# Patient Record
Sex: Male | Born: 1980 | Race: White | Hispanic: No | Marital: Married | Smoking: Current every day smoker
Health system: Southern US, Community
[De-identification: ages and names within clinical notes are randomized; demographics above are authoritative.]

## PROBLEM LIST (undated history)

## (undated) DIAGNOSIS — I10 Essential (primary) hypertension: Secondary | ICD-10-CM

## (undated) DIAGNOSIS — R569 Unspecified convulsions: Secondary | ICD-10-CM

## (undated) HISTORY — PX: OTHER SURGICAL HISTORY: SHX169

---

## 2008-01-03 ENCOUNTER — Emergency Department: Payer: Self-pay | Admitting: Emergency Medicine

## 2008-02-24 ENCOUNTER — Emergency Department: Payer: Self-pay | Admitting: Emergency Medicine

## 2008-04-10 ENCOUNTER — Emergency Department: Payer: Self-pay | Admitting: Emergency Medicine

## 2008-06-18 ENCOUNTER — Emergency Department: Payer: Self-pay | Admitting: Emergency Medicine

## 2008-06-18 ENCOUNTER — Other Ambulatory Visit: Payer: Self-pay

## 2008-09-09 ENCOUNTER — Emergency Department: Payer: Self-pay | Admitting: Emergency Medicine

## 2008-10-15 ENCOUNTER — Emergency Department: Payer: Self-pay | Admitting: Emergency Medicine

## 2009-06-07 ENCOUNTER — Emergency Department: Payer: Self-pay | Admitting: Emergency Medicine

## 2009-09-09 ENCOUNTER — Emergency Department: Payer: Self-pay | Admitting: Emergency Medicine

## 2009-09-14 ENCOUNTER — Emergency Department: Payer: Self-pay | Admitting: Emergency Medicine

## 2009-10-15 ENCOUNTER — Emergency Department: Payer: Self-pay | Admitting: Emergency Medicine

## 2009-12-30 ENCOUNTER — Emergency Department: Payer: Self-pay | Admitting: Internal Medicine

## 2010-01-21 ENCOUNTER — Emergency Department: Payer: Self-pay | Admitting: Emergency Medicine

## 2010-02-04 ENCOUNTER — Emergency Department: Payer: Self-pay | Admitting: Unknown Physician Specialty

## 2010-08-30 ENCOUNTER — Emergency Department: Payer: Self-pay | Admitting: Unknown Physician Specialty

## 2010-10-29 ENCOUNTER — Inpatient Hospital Stay: Payer: Self-pay | Admitting: Psychiatry

## 2010-11-24 ENCOUNTER — Emergency Department: Payer: Self-pay | Admitting: Emergency Medicine

## 2010-12-13 ENCOUNTER — Emergency Department: Payer: Self-pay | Admitting: Emergency Medicine

## 2011-03-28 ENCOUNTER — Emergency Department: Payer: Self-pay | Admitting: Emergency Medicine

## 2011-04-12 ENCOUNTER — Emergency Department: Payer: Self-pay | Admitting: Emergency Medicine

## 2011-04-13 ENCOUNTER — Emergency Department: Payer: Self-pay | Admitting: Emergency Medicine

## 2011-05-25 ENCOUNTER — Emergency Department: Payer: Self-pay | Admitting: Emergency Medicine

## 2011-05-27 ENCOUNTER — Emergency Department: Payer: Self-pay | Admitting: Unknown Physician Specialty

## 2011-07-04 ENCOUNTER — Emergency Department: Payer: Self-pay | Admitting: *Deleted

## 2011-12-04 ENCOUNTER — Emergency Department: Payer: Self-pay | Admitting: Emergency Medicine

## 2011-12-06 ENCOUNTER — Emergency Department: Payer: Self-pay | Admitting: Emergency Medicine

## 2012-02-02 ENCOUNTER — Emergency Department: Payer: Self-pay | Admitting: Emergency Medicine

## 2012-03-24 ENCOUNTER — Emergency Department: Payer: Self-pay | Admitting: Emergency Medicine

## 2013-05-02 ENCOUNTER — Emergency Department: Payer: Self-pay | Admitting: Internal Medicine

## 2013-05-02 LAB — CBC WITH DIFFERENTIAL/PLATELET
Basophil #: 0.1 10*3/uL (ref 0.0–0.1)
Basophil %: 0.4 %
Eosinophil #: 0.1 10*3/uL (ref 0.0–0.7)
Eosinophil %: 0.4 %
HCT: 39 % — ABNORMAL LOW (ref 40.0–52.0)
HGB: 14 g/dL (ref 13.0–18.0)
Lymphocyte #: 1.3 10*3/uL (ref 1.0–3.6)
Lymphocyte %: 7.3 %
MCH: 31.4 pg (ref 26.0–34.0)
MCHC: 35.9 g/dL (ref 32.0–36.0)
Monocyte #: 1.3 x10 3/mm — ABNORMAL HIGH (ref 0.2–1.0)
Monocyte %: 7.7 %
Platelet: 221 10*3/uL (ref 150–440)
RBC: 4.46 10*6/uL (ref 4.40–5.90)
RDW: 13.3 % (ref 11.5–14.5)
WBC: 17.4 10*3/uL — ABNORMAL HIGH (ref 3.8–10.6)

## 2013-05-02 LAB — URINALYSIS, COMPLETE
Bacteria: NONE SEEN
Bilirubin,UR: NEGATIVE
Glucose,UR: NEGATIVE mg/dL (ref 0–75)
Nitrite: NEGATIVE
Specific Gravity: 1.019 (ref 1.003–1.030)
Squamous Epithelial: NONE SEEN

## 2013-05-02 LAB — BASIC METABOLIC PANEL
Anion Gap: 7 (ref 7–16)
BUN: 11 mg/dL (ref 7–18)
Calcium, Total: 9.2 mg/dL (ref 8.5–10.1)
Creatinine: 0.67 mg/dL (ref 0.60–1.30)
EGFR (African American): >60
EGFR (Non-African Amer.): >60
Osmolality: 275 (ref 275–301)
Potassium: 3.6 mmol/L (ref 3.5–5.1)

## 2014-05-04 ENCOUNTER — Emergency Department: Payer: Self-pay | Admitting: Emergency Medicine

## 2014-07-10 ENCOUNTER — Emergency Department: Payer: Self-pay | Admitting: Internal Medicine

## 2014-07-28 ENCOUNTER — Emergency Department: Payer: Self-pay | Admitting: Emergency Medicine

## 2014-07-28 LAB — URINALYSIS, COMPLETE
BILIRUBIN, UR: NEGATIVE
BLOOD: NEGATIVE
Bacteria: NONE SEEN
GLUCOSE, UR: NEGATIVE mg/dL (ref 0–75)
Leukocyte Esterase: NEGATIVE
Nitrite: NEGATIVE
PH: 5 (ref 4.5–8.0)
Protein: NEGATIVE
RBC,UR: 6 /HPF (ref 0–5)
Specific Gravity: 1.035 (ref 1.003–1.030)
Squamous Epithelial: <1
WBC UR: 1 /HPF (ref 0–5)

## 2014-07-28 LAB — COMPREHENSIVE METABOLIC PANEL
AST: 16 U/L (ref 15–37)
Albumin: 3.7 g/dL (ref 3.4–5.0)
Alkaline Phosphatase: 90 U/L
Anion Gap: 7 (ref 7–16)
BUN: 14 mg/dL (ref 7–18)
Bilirubin,Total: 0.2 mg/dL (ref 0.2–1.0)
CREATININE: 0.81 mg/dL (ref 0.60–1.30)
Calcium, Total: 8.3 mg/dL — ABNORMAL LOW (ref 8.5–10.1)
Chloride: 108 mmol/L — ABNORMAL HIGH (ref 98–107)
Co2: 28 mmol/L (ref 21–32)
EGFR (African American): >60
EGFR (Non-African Amer.): >60
GLUCOSE: 82 mg/dL (ref 65–99)
OSMOLALITY: 285 (ref 275–301)
Potassium: 3.6 mmol/L (ref 3.5–5.1)
SGPT (ALT): 14 U/L
Sodium: 143 mmol/L (ref 136–145)
TOTAL PROTEIN: 6.8 g/dL (ref 6.4–8.2)

## 2014-07-28 LAB — CBC WITH DIFFERENTIAL/PLATELET
BASOS PCT: 0.4 %
Basophil #: 0 10*3/uL (ref 0.0–0.1)
Eosinophil #: 0.1 10*3/uL (ref 0.0–0.7)
Eosinophil %: 1.6 %
HCT: 40.2 % (ref 40.0–52.0)
HGB: 14.1 g/dL (ref 13.0–18.0)
LYMPHS ABS: 3 10*3/uL (ref 1.0–3.6)
Lymphocyte %: 34.2 %
MCH: 33.2 pg (ref 26.0–34.0)
MCHC: 35 g/dL (ref 32.0–36.0)
MCV: 95 fL (ref 80–100)
Monocyte #: 0.6 x10 3/mm (ref 0.2–1.0)
Monocyte %: 7.2 %
Neutrophil #: 5 10*3/uL (ref 1.4–6.5)
Neutrophil %: 56.6 %
Platelet: 211 10*3/uL (ref 150–440)
RBC: 4.23 10*6/uL — ABNORMAL LOW (ref 4.40–5.90)
RDW: 13.3 % (ref 11.5–14.5)
WBC: 8.8 10*3/uL (ref 3.8–10.6)

## 2014-07-28 LAB — LIPASE, BLOOD: LIPASE: 194 U/L (ref 73–393)

## 2014-07-28 LAB — TROPONIN I: Troponin-I: <0.02 ng/mL

## 2015-02-25 ENCOUNTER — Emergency Department: Payer: Self-pay | Admitting: Emergency Medicine

## 2015-03-22 ENCOUNTER — Emergency Department
Admit: 2015-03-22 | Disposition: A | Discharge status: Home / Self Care | Payer: Self-pay | Admitting: Physician Assistant

## 2015-06-17 ENCOUNTER — Emergency Department: Payer: Self-pay

## 2015-06-17 ENCOUNTER — Encounter: Payer: Self-pay | Admitting: Urgent Care

## 2015-06-17 ENCOUNTER — Emergency Department
Admission: EM | Admit: 2015-06-17 | Discharge: 2015-06-17 | Disposition: A | Discharge status: Home / Self Care | Payer: Self-pay | Attending: Emergency Medicine | Admitting: Emergency Medicine

## 2015-06-17 DIAGNOSIS — Z720 Tobacco use: Secondary | ICD-10-CM | POA: Insufficient documentation

## 2015-06-17 DIAGNOSIS — M778 Other enthesopathies, not elsewhere classified: Secondary | ICD-10-CM | POA: Insufficient documentation

## 2015-06-17 DIAGNOSIS — Z79899 Other long term (current) drug therapy: Secondary | ICD-10-CM | POA: Insufficient documentation

## 2015-06-17 HISTORY — DX: Essential (primary) hypertension: I10

## 2015-06-17 MED ORDER — TRAMADOL HCL 50 MG PO TABS: 50.0000 mg | ORAL_TABLET | Freq: Four times a day (QID) | ORAL | Status: DC | PRN

## 2015-06-17 MED ORDER — NAPROXEN 500 MG PO TABS
500.0000 mg | ORAL_TABLET | Freq: Once | ORAL | Status: AC
  Administered 2015-06-17: 500 mg via ORAL

## 2015-06-17 MED ORDER — TRAMADOL HCL 50 MG PO TABS
50.0000 mg | ORAL_TABLET | Freq: Once | ORAL | Status: AC
Start: 2015-06-17 — End: 2015-06-17
  Administered 2015-06-17: 50 mg via ORAL

## 2015-06-17 MED ORDER — NAPROXEN 500 MG PO TABS: 500.0000 mg | ORAL_TABLET | Freq: Two times a day (BID) | ORAL | Status: DC

## 2015-06-17 NOTE — ED Provider Notes (Signed)
[facility] Emergency Department Provider Note ____________________________________________  Time seen: Approximately 9:19 PM  I have reviewed the triage vital signs and the nursing notes.   HISTORY  Chief Complaint Hand Pain   HPI Nicholas Delgado is a 34 y.o. male who presents to the emergency room for right wrist and hand pain with radiation in to the forearm and elbow at times. Pain seems worse at night. He denies injury or repetitive motion. He is right hand dominate. Previous boxer's fracture. No previous injury to the area of pain.    Past Medical History  Diagnosis Date  . Hypertension     There are no active problems to display for this patient.   Past Surgical History  Procedure Laterality Date  . Arm surgery Left     Current Outpatient Rx  Name  Route  Sig  Dispense  Refill  . carvedilol (COREG) 6.25 MG tablet   Oral   Take 6.25 mg by mouth 2 (two) times daily with a meal.         . lisinopril (PRINIVIL,ZESTRIL) 20 MG tablet   Oral   Take 20 mg by mouth daily.         . naproxen (NAPROSYN) 500 MG tablet   Oral   Take 1 tablet (500 mg total) by mouth 2 (two) times daily with a meal.   60 tablet   2   . traMADol (ULTRAM) 50 MG tablet   Oral   Take 1 tablet (50 mg total) by mouth every 6 (six) hours as needed.   9 tablet   0     Allergies Sulfa antibiotics  No family history on file.  Social History History  Substance Use Topics  . Smoking status: Current Every Day Smoker -- 2.00 packs/day  . Smokeless tobacco: Not on file  . Alcohol Use: No    Review of Systems Constitutional: No recent illness. Eyes: No visual changes. ENT: No sore throat. Cardiovascular: Denies chest pain or palpitations. Respiratory: Denies shortness of breath. Gastrointestinal: No abdominal pain.  Genitourinary: Negative for dysuria. Musculoskeletal: Pain in right wrist/hand with radiation into forearm and elbow. Skin: Negative for  rash. Neurological: Negative for headaches, focal weakness or numbness. 10-point ROS otherwise negative.  ____________________________________________   PHYSICAL EXAM:  VITAL SIGNS: ED Triage Vitals  Enc Vitals Group     BP 06/17/15 2031 149/88 mmHg     Pulse Rate 06/17/15 2031 98     Resp 06/17/15 2031 18     Temp 06/17/15 2031 98.4 F (36.9 C)     Temp Source 06/17/15 2031 Oral     SpO2 06/17/15 2031 98 %     Weight 06/17/15 2031 170 lb (77.111 kg)     Height 06/17/15 2031 6' (1.829 m)     Head Cir --      Peak Flow --      Pain Score 06/17/15 2032 10     Pain Loc --      Pain Edu? --      Excl. in GC? --     Constitutional: Alert and oriented. Well appearing and in no acute distress. Eyes: Conjunctivae are normal. EOMI. Head: Atraumatic. Nose: No congestion/rhinnorhea. Neck: No stridor.  Respiratory: Normal respiratory effort.   Musculoskeletal: Very mild edema noted to hand. Tenderness diffuse over hand/wrist to palpation. Pain worse with flexion. ROM limited due to pain. No obvious deformity.  Neurologic:  Normal speech and language. No gross focal neurologic deficits are  appreciated. Speech is normal. No gait instability. Grip weaker on right due to pain. Skin:  Skin is warm, dry and intact. Atraumatic. Psychiatric: Mood and affect are normal. Speech and behavior are normal.  ____________________________________________   LABS (all labs ordered are listed, but only abnormal results are displayed)  Labs Reviewed - No data to display ____________________________________________  RADIOLOGY  Negative for acute pathology. ____________________________________________   PROCEDURES  Procedure(s) performed: Velcro wrist splint applied by tech. Neurovascularly intact post application.   ____________________________________________   INITIAL IMPRESSION / ASSESSMENT AND PLAN / ED COURSE  Pertinent labs & imaging results that were available during my care of  the patient were reviewed by me and considered in my medical decision making (see chart for details).  Patient was advised to follow up with the orthopedic doctor for symptoms that are not improving over the next 2 weeks. He was advised to return to the ER for symptoms that change or worsen if unable to schedule an appointment. ____________________________________________   FINAL CLINICAL IMPRESSION(S) / ED DIAGNOSES  Final diagnoses:  Right wrist tendonitis       Chinita Pester, FNP 06/17/15 2225  Myrna Blazer, MD 06/17/15 2232

## 2015-06-17 NOTE — ED Notes (Signed)
Patient with complaint of intermittent right hand pain times one month. Patient reports that the pain shoots up his arm to his elbow. Patient also reports swelling to his hand. Denies injury.

## 2015-06-17 NOTE — ED Notes (Signed)
Right hand pain and swelling with no known injury

## 2015-06-17 NOTE — ED Notes (Signed)
Patient presents with c/o RIGHT hand pain x 2 weeks. (+) swelling. Patient denies injury. Reports radiation of pain at times into shoulder.

## 2015-07-02 DIAGNOSIS — Z791 Long term (current) use of non-steroidal anti-inflammatories (NSAID): Secondary | ICD-10-CM | POA: Insufficient documentation

## 2015-07-02 DIAGNOSIS — Z720 Tobacco use: Secondary | ICD-10-CM | POA: Insufficient documentation

## 2015-07-02 DIAGNOSIS — Z79899 Other long term (current) drug therapy: Secondary | ICD-10-CM | POA: Insufficient documentation

## 2015-07-02 DIAGNOSIS — Y998 Other external cause status: Secondary | ICD-10-CM | POA: Insufficient documentation

## 2015-07-02 DIAGNOSIS — W208XXA Other cause of strike by thrown, projected or falling object, initial encounter: Secondary | ICD-10-CM | POA: Insufficient documentation

## 2015-07-02 DIAGNOSIS — Y9389 Activity, other specified: Secondary | ICD-10-CM | POA: Insufficient documentation

## 2015-07-02 DIAGNOSIS — I10 Essential (primary) hypertension: Secondary | ICD-10-CM | POA: Insufficient documentation

## 2015-07-02 DIAGNOSIS — S9031XA Contusion of right foot, initial encounter: Secondary | ICD-10-CM | POA: Insufficient documentation

## 2015-07-02 DIAGNOSIS — Y9289 Other specified places as the place of occurrence of the external cause: Secondary | ICD-10-CM | POA: Insufficient documentation

## 2015-07-03 ENCOUNTER — Emergency Department: Payer: Self-pay

## 2015-07-03 ENCOUNTER — Emergency Department
Admission: EM | Admit: 2015-07-03 | Discharge: 2015-07-03 | Disposition: A | Discharge status: Home / Self Care | Payer: Self-pay | Attending: Emergency Medicine | Admitting: Emergency Medicine

## 2015-07-03 ENCOUNTER — Encounter: Payer: Self-pay | Admitting: *Deleted

## 2015-07-03 DIAGNOSIS — T1490XA Injury, unspecified, initial encounter: Secondary | ICD-10-CM

## 2015-07-03 DIAGNOSIS — S9031XA Contusion of right foot, initial encounter: Secondary | ICD-10-CM

## 2015-07-03 MED ORDER — OXYCODONE-ACETAMINOPHEN 5-325 MG PO TABS
ORAL_TABLET | ORAL | Status: AC
  Filled 2015-07-03: qty 1

## 2015-07-03 MED ORDER — OXYCODONE-ACETAMINOPHEN 5-325 MG PO TABS: 1.0000 | ORAL_TABLET | ORAL | Status: DC | PRN

## 2015-07-03 MED ORDER — OXYCODONE-ACETAMINOPHEN 5-325 MG PO TABS: 1.0000 | ORAL_TABLET | Freq: Once | ORAL | Status: DC

## 2015-07-03 MED ORDER — OXYCODONE-ACETAMINOPHEN 5-325 MG PO TABS
1.0000 | ORAL_TABLET | Freq: Once | ORAL | Status: AC
Start: 2015-07-03 — End: 2015-07-03
  Administered 2015-07-03: 1 via ORAL
  Filled 2015-07-03: qty 1

## 2015-07-03 NOTE — ED Notes (Signed)
Pt to triage via wheelchair.  Pt has right foot pain.  Pt states a metal pipe fell onto right foot today    Pt was wearing boots when incident occurred.  Denies other injury.

## 2015-07-03 NOTE — Discharge Instructions (Signed)
Contusion °A contusion is a deep bruise. Contusions are the result of an injury that caused bleeding under the skin. The contusion may turn blue, purple, or yellow. Minor injuries will give you a painless contusion, but more severe contusions may stay painful and swollen for a few weeks.  °CAUSES  °A contusion is usually caused by a blow, trauma, or direct force to an area of the body. °SYMPTOMS  °· Swelling and redness of the injured area. °· Bruising of the injured area. °· Tenderness and soreness of the injured area. °· Pain. °DIAGNOSIS  °The diagnosis can be made by taking a history and physical exam. An X-ray, CT scan, or MRI may be needed to determine if there were any associated injuries, such as fractures. °TREATMENT  °Specific treatment will depend on what area of the body was injured. In general, the best treatment for a contusion is resting, icing, elevating, and applying cold compresses to the injured area. Over-the-counter medicines may also be recommended for pain control. Ask your caregiver what the best treatment is for your contusion. °HOME CARE INSTRUCTIONS  °· Put ice on the injured area. °¨ Put ice in a plastic bag. °¨ Place a towel between your skin and the bag. °¨ Leave the ice on for 15-20 minutes, 3-4 times a day, or as directed by your health care provider. °· Only take over-the-counter or prescription medicines for pain, discomfort, or fever as directed by your caregiver. Your caregiver may recommend avoiding anti-inflammatory medicines (aspirin, ibuprofen, and naproxen) for 48 hours because these medicines may increase bruising. °· Rest the injured area. °· If possible, elevate the injured area to reduce swelling. °SEEK IMMEDIATE MEDICAL CARE IF:  °· You have increased bruising or swelling. °· You have pain that is getting worse. °· Your swelling or pain is not relieved with medicines. °MAKE SURE YOU:  °· Understand these instructions. °· Will watch your condition. °· Will get help right  away if you are not doing well or get worse. °Document Released: 09/15/2005 Document Revised: 12/11/2013 Document Reviewed: 10/11/2011 °[employer]® Patient Information ©2015 [employer]. This information is not intended to replace advice given to you by your health care provider. Make sure you discuss any questions you have with your health care provider. ° °

## 2015-07-03 NOTE — ED Provider Notes (Signed)
[facility] Emergency Department Provider Note  ____________________________________________  Time seen: 5:00 AM  I have reviewed the triage vital signs and the nursing notes.   HISTORY  Chief Complaint Foot Injury      HPI Nicholas Delgado is a 34 y.o. male presents with right foot pain 7 out of 10. Patient states that a metal pipe fell on his foot today. Patient states he was wearing boots at the time of the incident.     Past Medical History  Diagnosis Date  . Hypertension     There are no active problems to display for this patient.   Past Surgical History  Procedure Laterality Date  . Arm surgery Left     Current Outpatient Rx  Name  Route  Sig  Dispense  Refill  . carvedilol (COREG) 6.25 MG tablet   Oral   Take 6.25 mg by mouth 2 (two) times daily with a meal.         . lisinopril (PRINIVIL,ZESTRIL) 20 MG tablet   Oral   Take 20 mg by mouth daily.         . naproxen (NAPROSYN) 500 MG tablet   Oral   Take 1 tablet (500 mg total) by mouth 2 (two) times daily with a meal.   60 tablet   2   . traMADol (ULTRAM) 50 MG tablet   Oral   Take 1 tablet (50 mg total) by mouth every 6 (six) hours as needed.   9 tablet   0     Allergies Sulfa antibiotics  No family history on file.  Social History History  Substance Use Topics  . Smoking status: Current Every Day Smoker -- 2.00 packs/day  . Smokeless tobacco: Not on file  . Alcohol Use: No    Review of Systems  Constitutional: Negative for fever. Eyes: Negative for visual changes. ENT: Negative for sore throat. Cardiovascular: Negative for chest pain. Respiratory: Negative for shortness of breath. Gastrointestinal: Negative for abdominal pain, vomiting and diarrhea. Genitourinary: Negative for dysuria. Musculoskeletal: Negative for back pain. Positive for right foot pain Skin: Negative for rash. Neurological: Negative for headaches, focal weakness or  numbness.  10-point ROS otherwise negative.  ____________________________________________   PHYSICAL EXAM:  VITAL SIGNS: ED Triage Vitals  Enc Vitals Group     BP 07/03/15 0007 146/96 mmHg     Pulse Rate 07/03/15 0007 104     Resp 07/03/15 0007 20     Temp 07/03/15 0007 99.6 F (37.6 C)     Temp Source 07/03/15 0007 Oral     SpO2 07/03/15 0007 98 %     Weight 07/03/15 0007 175 lb (79.379 kg)     Height 07/03/15 0007 6' (1.829 m)     Head Cir --      Peak Flow --      Pain Score 07/03/15 0009 8     Pain Loc --      Pain Edu? --      Excl. in GC? --     Constitutional: Alert and oriented. Well appearing and in no distress. Eyes: Conjunctivae are normal. PERRL. Normal extraocular movements. ENT   Head: Normocephalic and atraumatic.   Nose: No congestion/rhinnorhea.   Mouth/Throat: Mucous membranes are moist.   Neck: No stridor. Hematological/Lymphatic/Immunilogical: No cervical lymphadenopathy. Cardiovascular: Normal rate, regular rhythm. Normal and symmetric distal pulses are present in all extremities. No murmurs, rubs, or gallops. Respiratory: Normal respiratory effort without tachypnea nor retractions. Breath sounds  are clear and equal bilaterally. No wheezes/rales/rhonchi. Gastrointestinal: Soft and nontender. No distention. There is no CVA tenderness. Genitourinary: deferred Musculoskeletal: Nontender with normal range of motion in all extremities. No joint effusions.  No lower extremity tenderness nor edema. Neurologic:  Normal speech and language. No gross focal neurologic deficits are appreciated. Speech is normal.  Skin:  Skin is warm, dry and intact. No rash noted. Psychiatric: Mood and affect are normal. Speech and behavior are normal. Patient exhibits appropriate insight and judgment.  ____________________________________________       RADIOLOGY  Right foot x-ray revealed:  IMPRESSION: Negative.   Electronically Signed By: Burman NievesWilliam  Stevens M.D. On: 07/03/2015 01:13   ____________________________________________     INITIAL IMPRESSION / ASSESSMENT AND PLAN / ED COURSE  Pertinent labs & imaging results that were available during my care of the patient were reviewed by me and considered in my medical decision making (see chart for details). History and physical exam, foot x-ray consistent with right foot contusion   ____________________________________________   FINAL CLINICAL IMPRESSION(S) / ED DIAGNOSES  Final diagnoses:  Foot contusion, right, initial encounter      Darci Currentandolph N Brown, MD 07/08/15 [phone]

## 2015-07-03 NOTE — ED Notes (Signed)
MD at bedside. 

## 2015-07-11 ENCOUNTER — Emergency Department
Admission: EM | Admit: 2015-07-11 | Discharge: 2015-07-11 | Disposition: A | Discharge status: Home / Self Care | Payer: BLUE CROSS/BLUE SHIELD | Attending: Emergency Medicine | Admitting: Emergency Medicine

## 2015-07-11 ENCOUNTER — Encounter: Payer: Self-pay | Admitting: Emergency Medicine

## 2015-07-11 DIAGNOSIS — S9031XD Contusion of right foot, subsequent encounter: Secondary | ICD-10-CM | POA: Diagnosis not present

## 2015-07-11 DIAGNOSIS — S92241A Displaced fracture of medial cuneiform of right foot, initial encounter for closed fracture: Secondary | ICD-10-CM | POA: Insufficient documentation

## 2015-07-11 DIAGNOSIS — Z79899 Other long term (current) drug therapy: Secondary | ICD-10-CM | POA: Diagnosis not present

## 2015-07-11 DIAGNOSIS — I10 Essential (primary) hypertension: Secondary | ICD-10-CM | POA: Diagnosis not present

## 2015-07-11 DIAGNOSIS — Z791 Long term (current) use of non-steroidal anti-inflammatories (NSAID): Secondary | ICD-10-CM | POA: Diagnosis not present

## 2015-07-11 DIAGNOSIS — Y9389 Activity, other specified: Secondary | ICD-10-CM | POA: Insufficient documentation

## 2015-07-11 DIAGNOSIS — S99921A Unspecified injury of right foot, initial encounter: Secondary | ICD-10-CM | POA: Diagnosis present

## 2015-07-11 DIAGNOSIS — Z720 Tobacco use: Secondary | ICD-10-CM | POA: Insufficient documentation

## 2015-07-11 DIAGNOSIS — Y998 Other external cause status: Secondary | ICD-10-CM | POA: Diagnosis not present

## 2015-07-11 DIAGNOSIS — W208XXA Other cause of strike by thrown, projected or falling object, initial encounter: Secondary | ICD-10-CM | POA: Insufficient documentation

## 2015-07-11 DIAGNOSIS — Y9289 Other specified places as the place of occurrence of the external cause: Secondary | ICD-10-CM | POA: Insufficient documentation

## 2015-07-11 MED ORDER — TRAMADOL HCL 50 MG PO TABS
50.0000 mg | ORAL_TABLET | Freq: Four times a day (QID) | ORAL | Status: DC | PRN
Start: 2015-07-11 — End: 2015-08-26

## 2015-07-11 MED ORDER — MELOXICAM 15 MG PO TABS: 15.0000 mg | ORAL_TABLET | Freq: Every day | ORAL | Status: DC

## 2015-07-11 NOTE — ED Notes (Signed)
Pt states he dropped a pipe on his right foot one week ago and xray was negative. Pt states he has had continued pain in foot.

## 2015-07-11 NOTE — Discharge Instructions (Signed)
°  Splint Care Splints protect and rest injuries. Splints can be made of plaster, fiberglass, or metal. They are used to treat broken bones, sprains, tendonitis, and other injuries. HOME CARE  Keep the injured area raised (elevated) while sitting or lying down. Keep the injured body part just above the level of the heart. This will decrease puffiness (swelling) and pain.  If an elastic bandage was used to hold the splint, it can be loosened. Only loosen it to make room for puffiness and to ease pain.  Keep the splint clean and dry.  Do not scratch the skin under the splint with sharp or pointed objects.  Follow up with your doctor as told. GET HELP RIGHT AWAY IF:   There is more pain or pressure around the injury.  There is numbness, tingling, or pain in the toes or fingers past the injury.  The fingers or toes become cold or blue.  The splint becomes too soft or breaks before the injury is healed. MAKE SURE YOU:   Understand these instructions.  Will watch this condition.  Will get help right away if you are not doing well or get worse. Document Released: 09/14/2008 Document Revised: 02/28/2012 Document Reviewed: 09/14/2008 [facility] Patient Information 2015 [place], [place]. This information is not intended to replace advice given to you by your health care provider. Make sure you discuss any questions you have with your health care provider.  Cuneiform Fracture, Simple Cuneiform Fracture (simple) is a break (fracture) of one of your cuneiform bones. This is one of the bones located in the middle of your foot. When fractures are small and not out of place, they may be treated conservatively. This means that only a cast is needed for treatment. At first, no walking may be allowed, but following a period of time, your caregiver may allow you to have a walking cast. DIAGNOSIS  The diagnosis of a fractured cuneiform is made by X-ray. X-rays may be required before and after the injury is  put into a splint or cast. HOME CARE INSTRUCTIONS   Only take over-the-counter or prescription medicines for pain, discomfort or fever as directed by your caregiver.  If you have a splint held on with an elastic wrap and your foot or toes become numb or cold and blue, loosen the wrap and reapply more loosely. See your caregiver if there is no relief.  Use your injured foot as directed. Do not drive a vehicle until your caregiver specifically tells you it is safe to do so.  WARNING: See your caregiver as directed. It is important to keep all follow-up appointments in order to provide the best opportunity for healing and to avoid disability and chronic pain. SEEK IMMEDIATE MEDICAL CARE IF:   There is swelling or increasing pain in foot.  You begin to lose feeling in your foot or toes, or develop swelling of the foot or toes.  The foot or toes on the injured side are blue or cold.  You develop pain, which is not controlled by the medications. MAKE SURE YOU:   Understand these instructions.  Will watch your condition.  Will get help right away if you are not doing well or get worse. Document Released: 08/28/2002 Document Revised: 02/28/2012 Document Reviewed: 07/11/2008 [facility] Patient Information 2015 [place], [place]. This information is not intended to replace advice given to you by your health care provider. Make sure you discuss any questions you have with your health care provider.

## 2015-07-11 NOTE — ED Provider Notes (Signed)
CSN: [id number]     Arrival date & time 07/11/15  1956 History   First MD Initiated Contact with Patient 07/11/15 2018     Chief Complaint  Patient presents with  . Foot Injury     (Consider location/radiation/quality/duration/timing/severity/associated sxs/prior Treatment) HPI  34 year old male presents to the emergency department for evaluation of right foot pain. Initial injury occurred on 07/03/2015, he was seen in the emergency department that day and had x-rays of the right foot. X-rays were read as no acute fracture. Pain occurred as he dropped a heavy pipe onto his right foot. She was wearing his boots. His pain is on the dorsum of the right midfoot just above the medial cuneiform his pain has been 7 out of 10. He has been walking on this with pain. He is been unable to work as he stands. As any numbness or tingling. States he has significant bruising and swelling that has resolved over the last few days. Any medications for pain. Past Medical History  Diagnosis Date  . Hypertension    Past Surgical History  Procedure Laterality Date  . Arm surgery Left    History reviewed. No pertinent family history. History  Substance Use Topics  . Smoking status: Current Every Day Smoker -- 2.00 packs/day  . Smokeless tobacco: Not on file  . Alcohol Use: No    Review of Systems  Constitutional: Negative.  Negative for fever and activity change.  HENT: Negative for congestion, sore throat and trouble swallowing.   Eyes: Negative for photophobia, pain and discharge.  Respiratory: Negative for cough, chest tightness and shortness of breath.   Cardiovascular: Negative for chest pain and leg swelling.  Gastrointestinal: Negative for nausea, vomiting, abdominal pain and diarrhea.  Musculoskeletal: Positive for joint swelling, arthralgias and gait problem. Negative for back pain.  Skin: Negative for color change and rash.  Neurological: Negative for dizziness and headaches.  Hematological:  Negative for adenopathy.  Psychiatric/Behavioral: Negative for behavioral problems and agitation.      Allergies  Sulfa antibiotics  Home Medications   Prior to Admission medications   Medication Sig Start Date End Date Taking? Authorizing Provider  carvedilol (COREG) 6.25 MG tablet Take 6.25 mg by mouth 2 (two) times daily with a meal.    Historical Provider, MD  lisinopril (PRINIVIL,ZESTRIL) 20 MG tablet Take 20 mg by mouth daily.    Historical Provider, MD  meloxicam (MOBIC) 15 MG tablet Take 1 tablet (15 mg total) by mouth daily. 07/11/15   Evon Slack, PA-C  naproxen (NAPROSYN) 500 MG tablet Take 1 tablet (500 mg total) by mouth 2 (two) times daily with a meal. 06/17/15 06/16/16  Chinita Pester, FNP  oxyCODONE-acetaminophen (PERCOCET/ROXICET) 5-325 MG per tablet Take 1 tablet by mouth every 4 (four) hours as needed for severe pain. 07/03/15   Darci Current, MD  traMADol (ULTRAM) 50 MG tablet Take 1 tablet (50 mg total) by mouth every 6 (six) hours as needed. 06/17/15   Chinita Pester, FNP  traMADol (ULTRAM) 50 MG tablet Take 1 tablet (50 mg total) by mouth every 6 (six) hours as needed. 07/11/15   Evon Slack, PA-C   BP 144/101 mmHg  Temp(Src) 98.6 F (37 C) (Oral)  Resp 20  Ht 6' (1.829 m)  Wt 180 lb (81.647 kg)  BMI 24.41 kg/m2  SpO2 100% Physical Exam  Constitutional: He is oriented to person, place, and time. He appears well-developed and well-nourished.  HENT:  Head: Normocephalic and atraumatic.  Eyes: Conjunctivae and EOM are normal. Pupils are equal, round, and reactive to light.  Neck: Normal range of motion. Neck supple.  Cardiovascular: Normal rate and intact distal pulses.   Pulmonary/Chest: Effort normal. No respiratory distress.  Musculoskeletal:  Right foot with no swelling warmth or erythema. He is tender to palpation over the dorsal aspect of the right foot along the medial cuneiform. He is point tender. No loss of ankle range of motion. Negative  Thompson's test. Sensation is intact throughout the right foot.  Neurological: He is alert and oriented to person, place, and time.  Skin: Skin is warm and dry.  Psychiatric: He has a normal mood and affect. His behavior is normal. Judgment and thought content normal.    ED Course  Procedures (including critical care time) The patient was placed into a right short leg splint with Ortho-Glass. Splint was applied by tech. Crutches were given. She was neurovascular intact after splint application.   Labs Review Labs Reviewed - No data to display  Imaging Review No results found.  X-rays of the right foot reviewed by me from [facility] on 07/03/2015: There appears to be a small avulsion off the medial cuneiform which correlates with patient's pain.   EKG Interpretation None      MDM   Final diagnoses:  Closed fracture of medial cuneiform bone of foot, right, initial encounter  Contusion, foot, right, subsequent encounter    34 year old male with injury to his right foot on 07/03/2015. X-rays were read as no acute fracture. After review of the x-rays, there appears to be a very tiny avulsion fracture on the dorsal aspect of the medial cuneiform bone. Patient was placed into a short leg splint for comfort. He was given crutches to stay nonweightbearing. He will follow-up with Dr. Deeann Saint at [facility] in 2-3 days. He is given a prescription for Ultram and meloxicam for pain. He will rest ice and elevate the lower extremity    Evon Slack, PA-C 07/11/15 2053  Myrna Blazer, MD 07/12/15 [phone]

## 2015-08-24 ENCOUNTER — Emergency Department
Admission: EM | Admit: 2015-08-24 | Discharge: 2015-08-24 | Disposition: A | Discharge status: Other Acute Inpatient Hospital | Payer: BLUE CROSS/BLUE SHIELD | Attending: Emergency Medicine | Admitting: Emergency Medicine

## 2015-08-24 ENCOUNTER — Inpatient Hospital Stay
Admission: AD | Admit: 2015-08-24 | Discharge: 2015-08-26 | DRG: 897 | Disposition: A | Discharge status: Home / Self Care | Payer: BLUE CROSS/BLUE SHIELD | Source: Intra-hospital | Attending: Psychiatry | Admitting: Psychiatry

## 2015-08-24 DIAGNOSIS — G4700 Insomnia, unspecified: Secondary | ICD-10-CM | POA: Diagnosis present

## 2015-08-24 DIAGNOSIS — F1110 Opioid abuse, uncomplicated: Secondary | ICD-10-CM | POA: Insufficient documentation

## 2015-08-24 DIAGNOSIS — Z791 Long term (current) use of non-steroidal anti-inflammatories (NSAID): Secondary | ICD-10-CM | POA: Insufficient documentation

## 2015-08-24 DIAGNOSIS — Z720 Tobacco use: Secondary | ICD-10-CM | POA: Insufficient documentation

## 2015-08-24 DIAGNOSIS — E785 Hyperlipidemia, unspecified: Secondary | ICD-10-CM

## 2015-08-24 DIAGNOSIS — F1120 Opioid dependence, uncomplicated: Secondary | ICD-10-CM | POA: Diagnosis present

## 2015-08-24 DIAGNOSIS — F3289 Other specified depressive episodes: Secondary | ICD-10-CM

## 2015-08-24 DIAGNOSIS — F1194 Opioid use, unspecified with opioid-induced mood disorder: Secondary | ICD-10-CM | POA: Diagnosis not present

## 2015-08-24 DIAGNOSIS — Z79899 Other long term (current) drug therapy: Secondary | ICD-10-CM | POA: Diagnosis not present

## 2015-08-24 DIAGNOSIS — F1123 Opioid dependence with withdrawal: Secondary | ICD-10-CM | POA: Diagnosis present

## 2015-08-24 DIAGNOSIS — F17210 Nicotine dependence, cigarettes, uncomplicated: Secondary | ICD-10-CM | POA: Diagnosis present

## 2015-08-24 DIAGNOSIS — I10 Essential (primary) hypertension: Secondary | ICD-10-CM | POA: Insufficient documentation

## 2015-08-24 DIAGNOSIS — F1124 Opioid dependence with opioid-induced mood disorder: Secondary | ICD-10-CM | POA: Diagnosis present

## 2015-08-24 DIAGNOSIS — F329 Major depressive disorder, single episode, unspecified: Secondary | ICD-10-CM | POA: Diagnosis present

## 2015-08-24 DIAGNOSIS — Z9889 Other specified postprocedural states: Secondary | ICD-10-CM

## 2015-08-24 DIAGNOSIS — Z818 Family history of other mental and behavioral disorders: Secondary | ICD-10-CM

## 2015-08-24 DIAGNOSIS — F17200 Nicotine dependence, unspecified, uncomplicated: Secondary | ICD-10-CM

## 2015-08-24 DIAGNOSIS — F1193 Opioid use, unspecified with withdrawal: Secondary | ICD-10-CM

## 2015-08-24 DIAGNOSIS — F32A Depression, unspecified: Secondary | ICD-10-CM

## 2015-08-24 LAB — URINE DRUG SCREEN, QUALITATIVE (ARMC ONLY)
Amphetamines, Ur Screen: NOT DETECTED
BENZODIAZEPINE, UR SCRN: POSITIVE — AB
Barbiturates, Ur Screen: NOT DETECTED
CANNABINOID 50 NG, UR ~~LOC~~: NOT DETECTED
Cocaine Metabolite,Ur ~~LOC~~: NOT DETECTED
MDMA (Ecstasy)Ur Screen: NOT DETECTED
Methadone Scn, Ur: NOT DETECTED
Opiate, Ur Screen: POSITIVE — AB
Phencyclidine (PCP) Ur S: NOT DETECTED
TRICYCLIC, UR SCREEN: NOT DETECTED

## 2015-08-24 LAB — COMPREHENSIVE METABOLIC PANEL
ALBUMIN: 3.8 g/dL (ref 3.5–5.0)
ALK PHOS: 90 U/L (ref 38–126)
ALT: 14 U/L — ABNORMAL LOW (ref 17–63)
ANION GAP: 6 (ref 5–15)
AST: 28 U/L (ref 15–41)
BUN: 18 mg/dL (ref 6–20)
CO2: 26 mmol/L (ref 22–32)
Calcium: 8.6 mg/dL — ABNORMAL LOW (ref 8.9–10.3)
Chloride: 106 mmol/L (ref 101–111)
Creatinine, Ser: 0.63 mg/dL (ref 0.61–1.24)
GFR calc Af Amer: >60 mL/min (ref >60)
GFR calc non Af Amer: >60 mL/min (ref >60)
GLUCOSE: 127 mg/dL — AB (ref 65–99)
Potassium: 3.8 mmol/L (ref 3.5–5.1)
SODIUM: 138 mmol/L (ref 135–145)
Total Bilirubin: 0.7 mg/dL (ref 0.3–1.2)
Total Protein: 6.5 g/dL (ref 6.5–8.1)

## 2015-08-24 LAB — ACETAMINOPHEN LEVEL
ACETAMINOPHEN (TYLENOL), SERUM: 14 ug/mL (ref 10–30)
ACETAMINOPHEN (TYLENOL), SERUM: 14 ug/mL (ref 10–30)

## 2015-08-24 LAB — CBC
HEMATOCRIT: 37.2 % — AB (ref 40.0–52.0)
HEMOGLOBIN: 13.2 g/dL (ref 13.0–18.0)
MCH: 31.8 pg (ref 26.0–34.0)
MCHC: 35.5 g/dL (ref 32.0–36.0)
MCV: 89.6 fL (ref 80.0–100.0)
PLATELETS: 171 10*3/uL (ref 150–440)
RBC: 4.15 MIL/uL — ABNORMAL LOW (ref 4.40–5.90)
RDW: 13.5 % (ref 11.5–14.5)
WBC: 7.7 10*3/uL (ref 3.8–10.6)

## 2015-08-24 LAB — SALICYLATE LEVEL
SALICYLATE LVL: 6.6 mg/dL (ref 2.8–30.0)
Salicylate Lvl: 5.2 mg/dL (ref 2.8–30.0)
Salicylate Lvl: 4.9 mg/dL (ref 2.8–30.0)

## 2015-08-24 LAB — ETHANOL: Alcohol, Ethyl (B): <5 mg/dL (ref <5)

## 2015-08-24 MED ORDER — GABAPENTIN 300 MG PO CAPS
900.0000 mg | ORAL_CAPSULE | Freq: Four times a day (QID) | ORAL | Status: DC
  Administered 2015-08-24 – 2015-08-26 (×7): 900 mg via ORAL
  Filled 2015-08-24 (×7): qty 3

## 2015-08-24 MED ORDER — MAGNESIUM HYDROXIDE 400 MG/5ML PO SUSP: 30.0000 mL | Freq: Every day | ORAL | Status: DC | PRN

## 2015-08-24 MED ORDER — CARVEDILOL 6.25 MG PO TABS
6.2500 mg | ORAL_TABLET | Freq: Two times a day (BID) | ORAL | Status: DC
  Administered 2015-08-25 – 2015-08-26 (×3): 6.25 mg via ORAL
  Filled 2015-08-24 (×3): qty 1

## 2015-08-24 MED ORDER — ACETAMINOPHEN 325 MG PO TABS
650.0000 mg | ORAL_TABLET | Freq: Four times a day (QID) | ORAL | Status: DC | PRN
  Administered 2015-08-24: 650 mg via ORAL
  Filled 2015-08-24: qty 2

## 2015-08-24 MED ORDER — NICOTINE 21 MG/24HR TD PT24
21.0000 mg | MEDICATED_PATCH | Freq: Every day | TRANSDERMAL | Status: DC
  Administered 2015-08-25: 21 mg via TRANSDERMAL
  Filled 2015-08-24: qty 1

## 2015-08-24 MED ORDER — LORAZEPAM 1 MG PO TABS
1.0000 mg | ORAL_TABLET | Freq: Once | ORAL | Status: AC
  Administered 2015-08-24: 1 mg via ORAL

## 2015-08-24 MED ORDER — TRAZODONE HCL 100 MG PO TABS
100.0000 mg | ORAL_TABLET | Freq: Every evening | ORAL | Status: DC | PRN
  Administered 2015-08-24: 100 mg via ORAL
  Filled 2015-08-24: qty 1

## 2015-08-24 MED ORDER — LORAZEPAM 1 MG PO TABS
ORAL_TABLET | ORAL | Status: AC
  Administered 2015-08-24: 1 mg via ORAL
  Filled 2015-08-24: qty 1

## 2015-08-24 MED ORDER — DICLOFENAC SODIUM 1 % TD GEL
2.0000 g | Freq: Two times a day (BID) | TRANSDERMAL | Status: DC
  Administered 2015-08-25: 2 g via TOPICAL
  Filled 2015-08-24: qty 100

## 2015-08-24 MED ORDER — ALUM & MAG HYDROXIDE-SIMETH 200-200-20 MG/5ML PO SUSP: 30.0000 mL | ORAL | Status: DC | PRN

## 2015-08-24 MED ORDER — MELOXICAM 7.5 MG PO TABS
15.0000 mg | ORAL_TABLET | Freq: Every day | ORAL | Status: DC
  Administered 2015-08-25 – 2015-08-26 (×2): 15 mg via ORAL
  Filled 2015-08-24 (×2): qty 2

## 2015-08-24 MED ORDER — HYDROXYZINE HCL 50 MG PO TABS
50.0000 mg | ORAL_TABLET | Freq: Four times a day (QID) | ORAL | Status: DC | PRN
  Administered 2015-08-24: 50 mg via ORAL
  Filled 2015-08-24: qty 1

## 2015-08-24 MED ORDER — ATORVASTATIN CALCIUM 20 MG PO TABS
20.0000 mg | ORAL_TABLET | Freq: Every day | ORAL | Status: DC
  Administered 2015-08-24 – 2015-08-25 (×2): 20 mg via ORAL
  Filled 2015-08-24 (×2): qty 1

## 2015-08-24 MED ORDER — LOPERAMIDE HCL 2 MG PO CAPS
2.0000 mg | ORAL_CAPSULE | Freq: Three times a day (TID) | ORAL | Status: DC
  Administered 2015-08-24 – 2015-08-26 (×5): 2 mg via ORAL
  Filled 2015-08-24 (×5): qty 1

## 2015-08-24 MED ORDER — HYDROXYZINE HCL 25 MG PO TABS
50.0000 mg | ORAL_TABLET | Freq: Once | ORAL | Status: AC
  Administered 2015-08-24: 50 mg via ORAL
  Filled 2015-08-24: qty 2

## 2015-08-24 MED ORDER — LISINOPRIL 10 MG PO TABS
20.0000 mg | ORAL_TABLET | Freq: Every day | ORAL | Status: DC
  Administered 2015-08-25 – 2015-08-26 (×2): 20 mg via ORAL
  Filled 2015-08-24 (×2): qty 2

## 2015-08-24 MED ORDER — PROMETHAZINE HCL 25 MG PO TABS
12.5000 mg | ORAL_TABLET | Freq: Three times a day (TID) | ORAL | Status: DC
  Administered 2015-08-24 – 2015-08-26 (×6): 12.5 mg via ORAL
  Filled 2015-08-24 (×6): qty 1

## 2015-08-24 NOTE — ED Notes (Signed)
BEHAVIORAL HEALTH ROUNDING Patient sleeping: Yes.   Patient alert and oriented: yes Behavior appropriate: Yes.  ; If no, describe:  Nutrition and fluids offered: Yes  Toileting and hygiene offered: Yes  Sitter present: yes Law enforcement present: Yes ODS  

## 2015-08-24 NOTE — ED Notes (Signed)

## 2015-08-24 NOTE — ED Notes (Signed)
NAD noted at this time. Pt resting comfortably in bed. Pt noted to be snoring at this time. Respirations even and unlabored.

## 2015-08-24 NOTE — ED Notes (Signed)
BEHAVIORAL HEALTH ROUNDING Patient sleeping: No. Patient alert and oriented: yes Behavior appropriate: Yes.  ; If no, describe:  Nutrition and fluids offered: Yes  Toileting and hygiene offered: Yes  Sitter present: no Law enforcement present: Yes  

## 2015-08-24 NOTE — ED Notes (Signed)
Pt noted to be laying in bed at this time. NAD noted. Respirations even and unlabored.

## 2015-08-24 NOTE — ED Notes (Signed)
BEHAVIORAL HEALTH ROUNDING Patient sleeping: Yes.   Patient alert and oriented: pt sleeping Behavior appropriate: Yes.  ; If no, describe: Nutrition and fluids offered: Yes  Toileting and hygiene offered: Yes  Sitter present: no Law enforcement present: Yes  

## 2015-08-24 NOTE — ED Notes (Signed)
NAD noted at this time. Pt noted to be laying in bed with the TV on, respirations are even and unlabored. Pt awakens to mild stimuli.

## 2015-08-24 NOTE — ED Notes (Signed)

## 2015-08-24 NOTE — ED Notes (Signed)
BEHAVIORAL HEALTH ROUNDING Patient sleeping: No. Patient alert and oriented: yes Behavior appropriate: Yes.  ; If no, describe:  Nutrition and fluids offered: Yes  Toileting and hygiene offered: Yes  Sitter present: yes Law enforcement present: Yes  

## 2015-08-24 NOTE — ED Notes (Signed)
ED BHU PLACEMENT JUSTIFICATION Is the patient under IVC or is there intent for IVC: No. Is the patient medically cleared: Yes.   Is there vacancy in the ED BHU: No. Is the population mix appropriate for patient: Yes.   Is the patient awaiting placement in inpatient or outpatient setting: Yes.   Has the patient had a psychiatric consult: No. Survey of unit performed for contraband, proper placement and condition of furniture, tampering with fixtures in bathroom, shower, and each patient room: Yes.  ; Findings: all clear APPEARANCE/BEHAVIOR calm, cooperative and adequate rapport can be established NEURO ASSESSMENT Orientation: time, place and person Hallucinations: No.None noted (Hallucinations) Speech: Normal Gait: normal RESPIRATORY ASSESSMENT Normal expansion.  Clear to auscultation.  No rales, rhonchi, or wheezing. CARDIOVASCULAR ASSESSMENT regular rate and rhythm, S1, S2 normal, no murmur, click, rub or gallop GASTROINTESTINAL ASSESSMENT soft, nontender, BS WNL, no r/g EXTREMITIES normal strength, tone, and muscle mass, ROM of all joints is normal PLAN OF CARE Provide calm/safe environment. Vital signs assessed twice daily. ED BHU Assessment once each 12-hour shift. Collaborate with intake RN daily or as condition indicates. Assure the ED provider has rounded once each shift. Provide and encourage hygiene. Provide redirection as needed. Assess for escalating behavior; address immediately and inform ED provider.  Assess family dynamic and appropriateness for visitation as needed: Yes.  ; If necessary, describe findings:  Educate the patient/family about BHU procedures/visitation: Yes.  ; If necessary, describe findings:

## 2015-08-24 NOTE — ED Notes (Signed)
Patient assigned to appropriate care area. Patient oriented to unit/care area: Informed that, for their safety, care areas are designed for safety and monitored by security cameras at all times; and visiting hours explained to patient. Patient verbalizes understanding, and verbal contract for safety obtained. 
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
## 2015-08-24 NOTE — ED Notes (Signed)
NAD noted at this time. Pt noted to the sleeping in his bed, snore noted. Respirations even and unlabored at this time.

## 2015-08-24 NOTE — Consult Note (Signed)
[place] Psychiatry Consult   Reason for Consult:  Alcohol use.  Referring Physician:  Paulette Blanch, MD  Patient Identification: Nicholas Delgado MRN:  [id number]   Principal Diagnosis: Opioid use disorder, severe, dependence Diagnosis:   Patient Active Problem List   Diagnosis Date Noted  . Opioid use disorder, severe, dependence [F11.20] 08/24/2015    Total Time spent with patient: 30 minutes  Subjective:   Nicholas Delgado is a 34 y.o. male patient admitted with a h/o depression who presents to [facility] ED for worsening depression with SI.  He attempted suicide last week by overdosing on pain pills and drinking antifreeze. He shared with Dr. Beather Arbour in the ED that he is addicted to opiate pain medications and he currently has not plan for suicide. His last opiate use was prior to arrival in the ED.   Nursing notes reviewed by this provider.   [facility] assessment note reviewed as well. Pt reports using opiates 14m QID for the past 15 years.  He endorses the following symptoms of withdrawal including diarrhea, sweats, tachycardia, chills, tremors, nausea, cramps.  Pt shared he attempted to overdose on BP pill and consuming windshield washer fluid 3 weeks ago. He was trying to kill himself.  He reports no current SI with verbalized the urge to overcome addiction.   Today on interview Nicholas Delgado tired and fatigued. He is easily aroused but speaks softly and mumbles. He is difficult to understand. His UDS is positive for opiates and benzodiazepines.   Nicholas Delgado he wants to detox from substances. He is using Percocets 10 tablets at a time. I was not able to understand what dosage he used but he notes he has been using Percocets for at least 15 years. He has developed a tolerance to this medication and craves the medication frequently. He lost several relationships and physical items due to opiates. He has attempted to cut down in the past but has not been successful.    He is  currently experiencing opiate withdrawals including nausea, diarrhea, chills and muscle cramps. Pt endorses feeling hopeless, helpless and worthless.  He shared his sleep is stable with decreased energy, concentration and appetite. He denies anhedonia and guilt. He currently denies SI, HI and AVH. There is a questionable h/o mania when the pt was not using drugs.  HPI:  See above HPI Elements:   Location:  see HPI. Quality:  worsening. Severity:  severe. Timing:  the past 3 weeks. Duration:  at least 15 years. Context:  opiate use.  Past Psychiatric History: Dx: opiate use disorder  Psychiatrist: Unable to understand patient Therapist: Unable to understand patient Hospitalizations: Unable to understand patient ECT: Denies Suicide attempt/Self-harm: Unable to understand patient Homicide attempts/harming others: Unable to understand patient  Past Medical History:  Past Medical History  Diagnosis Date  . Hypertension     Past Surgical History  Procedure Laterality Date  . Arm surgery Left    Family History: History reviewed. No pertinent family history. Social History:  History  Alcohol Use No     History  Drug Use  . Yes    Comment: percocet 111m   Social History   Social History  . Marital Status: Legally Separated    Spouse Name: N/A  . Number of Children: N/A  . Years of Education: N/A   Social History Main Topics  . Smoking status: Current Every Day Smoker -- 2.00 packs/day  . Smokeless tobacco: None  . Alcohol Use:  No  . Drug Use: Yes     Comment: percocet 57m  . Sexual Activity: Yes   Other Topics Concern  . None   Social History Narrative   Additional Social History:    Pain Medications: Opiate Abuse Prescriptions: None Reported Over the Counter: None Reported History of alcohol / drug use?: Yes Longest period of sobriety (when/how long): 2-3 months/ " I just stopped and started back"  Negative Consequences of Use:  (Not Reported) Withdrawal  Symptoms: Tachycardia, Sweats, Diarrhea, Fever / Chills, Tremors, Nausea / Vomiting, Cramps Name of Substance 1: Opiate  1 - Age of First Use: Not Reported 1 - Amount (size/oz): 1013m 1 - Frequency: 4x/day 1 - Duration: 15 years 1 - Last Use / Amount: Not reported  Allergies:   Allergies  Allergen Reactions  . Sulfa Antibiotics Shortness Of Breath    Labs:  Results for orders placed or performed during the hospital encounter of 08/24/15 (from the past 48 hour(s))  Comprehensive metabolic panel     Status: Abnormal   Collection Time: 08/24/15 12:46 AM  Result Value Ref Range   Sodium 138 135 - 145 mmol/L   Potassium 3.8 3.5 - 5.1 mmol/L   Chloride 106 101 - 111 mmol/L   CO2 26 22 - 32 mmol/L   Glucose, Bld 127 (H) 65 - 99 mg/dL   BUN 18 6 - 20 mg/dL   Creatinine, Ser 0.63 0.61 - 1.24 mg/dL   Calcium 8.6 (L) 8.9 - 10.3 mg/dL   Total Protein 6.5 6.5 - 8.1 g/dL   Albumin 3.8 3.5 - 5.0 g/dL   AST 28 15 - 41 U/L   ALT 14 (L) 17 - 63 U/L   Alkaline Phosphatase 90 38 - 126 U/L   Total Bilirubin 0.7 0.3 - 1.2 mg/dL   GFR calc non Af Amer >60 >60 mL/min   GFR calc Af Amer >60 >60 mL/min    Comment: (NOTE) The eGFR has been calculated using the CKD EPI equation. This calculation has not been validated in all clinical situations. eGFR's persistently <60 mL/min signify possible Chronic Kidney Disease.    Anion gap 6 5 - 15  Ethanol (ETOH)     Status: None   Collection Time: 08/24/15 12:46 AM  Result Value Ref Range   Alcohol, Ethyl (B) <5 <5 mg/dL    Comment:        LOWEST DETECTABLE LIMIT FOR SERUM ALCOHOL IS 5 mg/dL FOR MEDICAL PURPOSES ONLY   Salicylate level     Status: None   Collection Time: 08/24/15 12:46 AM  Result Value Ref Range   Salicylate Lvl 5.2 2.8 - 30.0 mg/dL  Acetaminophen level     Status: None   Collection Time: 08/24/15 12:46 AM  Result Value Ref Range   Acetaminophen (Tylenol), Serum 14 10 - 30 ug/mL    Comment:        THERAPEUTIC CONCENTRATIONS  VARY SIGNIFICANTLY. A RANGE OF 10-30 ug/mL MAY BE AN EFFECTIVE CONCENTRATION FOR MANY PATIENTS. HOWEVER, SOME ARE BEST TREATED AT CONCENTRATIONS OUTSIDE THIS RANGE. ACETAMINOPHEN CONCENTRATIONS >150 ug/mL AT 4 HOURS AFTER INGESTION AND >50 ug/mL AT 12 HOURS AFTER INGESTION ARE OFTEN ASSOCIATED WITH TOXIC REACTIONS.   CBC     Status: Abnormal   Collection Time: 08/24/15 12:46 AM  Result Value Ref Range   WBC 7.7 3.8 - 10.6 K/uL   RBC 4.15 (L) 4.40 - 5.90 MIL/uL   Hemoglobin 13.2 13.0 - 18.0 g/dL   HCT 37.2 (  L) 40.0 - 52.0 %   MCV 89.6 80.0 - 100.0 fL   MCH 31.8 26.0 - 34.0 pg   MCHC 35.5 32.0 - 36.0 g/dL   RDW 13.5 11.5 - 14.5 %   Platelets 171 150 - 440 K/uL  Urine Drug Screen, Qualitative ([facility] only)     Status: Abnormal   Collection Time: 08/24/15 12:46 AM  Result Value Ref Range   Tricyclic, Ur Screen NONE DETECTED NONE DETECTED   Amphetamines, Ur Screen NONE DETECTED NONE DETECTED   MDMA (Ecstasy)Ur Screen NONE DETECTED NONE DETECTED   Cocaine Metabolite,Ur [place] NONE DETECTED NONE DETECTED   Opiate, Ur Screen POSITIVE (A) NONE DETECTED   Phencyclidine (PCP) Ur S NONE DETECTED NONE DETECTED   Cannabinoid 50 Ng, Ur  NONE DETECTED NONE DETECTED   Barbiturates, Ur Screen NONE DETECTED NONE DETECTED   Benzodiazepine, Ur Scrn POSITIVE (A) NONE DETECTED   Methadone Scn, Ur NONE DETECTED NONE DETECTED    Comment: (NOTE) 536  Tricyclics, urine               Cutoff 1000 ng/mL 200  Amphetamines, urine             Cutoff 1000 ng/mL 300  MDMA (Ecstasy), urine           Cutoff 500 ng/mL 400  Cocaine Metabolite, urine       Cutoff 300 ng/mL 500  Opiate, urine                   Cutoff 300 ng/mL 600  Phencyclidine (PCP), urine      Cutoff 25 ng/mL 700  Cannabinoid, urine              Cutoff 50 ng/mL 800  Barbiturates, urine             Cutoff 200 ng/mL 900  Benzodiazepine, urine           Cutoff 200 ng/mL 1000 Methadone, urine                Cutoff 300 ng/mL 1100 1200 The urine  drug screen provides only a preliminary, unconfirmed 1300 analytical test result and should not be used for non-medical 1400 purposes. Clinical consideration and professional judgment should 1500 be applied to any positive drug screen result due to possible 1600 interfering substances. A more specific alternate chemical method 1700 must be used in order to obtain a confirmed analytical result.  1800 Gas chromato graphy / mass spectrometry (GC/MS) is the preferred 1900 confirmatory method.   Acetaminophen level     Status: None   Collection Time: 08/24/15 12:46 AM  Result Value Ref Range   Acetaminophen (Tylenol), Serum 14 10 - 30 ug/mL    Comment:        THERAPEUTIC CONCENTRATIONS VARY SIGNIFICANTLY. A RANGE OF 10-30 ug/mL MAY BE AN EFFECTIVE CONCENTRATION FOR MANY PATIENTS. HOWEVER, SOME ARE BEST TREATED AT CONCENTRATIONS OUTSIDE THIS RANGE. ACETAMINOPHEN CONCENTRATIONS >150 ug/mL AT 4 HOURS AFTER INGESTION AND >50 ug/mL AT 12 HOURS AFTER INGESTION ARE OFTEN ASSOCIATED WITH TOXIC REACTIONS.   Salicylate level     Status: None   Collection Time: 08/24/15 12:46 AM  Result Value Ref Range   Salicylate Lvl 4.9 2.8 - 30.0 mg/dL  Acetaminophen level     Status: Abnormal   Collection Time: 08/24/15  5:01 AM  Result Value Ref Range   Acetaminophen (Tylenol), Serum <10 (L) 10 - 30 ug/mL    Comment:  THERAPEUTIC CONCENTRATIONS VARY SIGNIFICANTLY. A RANGE OF 10-30 ug/mL MAY BE AN EFFECTIVE CONCENTRATION FOR MANY PATIENTS. HOWEVER, SOME ARE BEST TREATED AT CONCENTRATIONS OUTSIDE THIS RANGE. ACETAMINOPHEN CONCENTRATIONS >150 ug/mL AT 4 HOURS AFTER INGESTION AND >50 ug/mL AT 12 HOURS AFTER INGESTION ARE OFTEN ASSOCIATED WITH TOXIC REACTIONS.   Salicylate level     Status: None   Collection Time: 08/24/15  5:01 AM  Result Value Ref Range   Salicylate Lvl 6.6 2.8 - 30.0 mg/dL    Vitals: Blood pressure 116/81, pulse 85, temperature 98.2 F (36.8 C), temperature source  Oral, resp. rate 20, height 6' (1.829 m), weight 77.111 kg (170 lb), SpO2 97 %.  Risk to Self: Suicidal Ideation: No-Not Currently/Within Last 6 Months Suicidal Intent: No-Not Currently/Within Last 6 Months Is patient at risk for suicide?: Yes Suicidal Plan?: No-Not Currently/Within Last 6 Months Access to Means: Yes Specify Access to Suicidal Means: Access to pills and windshield wiper fluid (items utilized in recent suicide attempt) What has been your use of drugs/alcohol within the last 12 months?: Opiate use 113m 4x/day How many times?: 1 Other Self Harm Risks: Opiate withdrawl (trigger for recent suicide attempt) Triggers for Past Attempts: Other (Comment) (Substance Abuse) Intentional Self Injurious Behavior: None Risk to Others: Homicidal Ideation: No Thoughts of Harm to Others: No Current Homicidal Intent: No Current Homicidal Plan: No Access to Homicidal Means: No Identified Victim: N/A History of harm to others?: No Assessment of Violence: None Noted Violent Behavior Description: N/A Does patient have access to weapons?: No Criminal Charges Pending?: Yes (Pt. did not specify nature of charges or civil vs. criminal) Describe Pending Criminal Charges: "it'sa plea bargin then it's over" Does patient have a court date: Yes Court Date: 09/09/15 Prior Inpatient Therapy: Prior Inpatient Therapy: Yes Prior Therapy Dates: Not Provided [facility] Prior Outpatient Therapy: Prior Outpatient Therapy: Yes Prior Therapy Dates: 1995 Prior Therapy [facility]ilty/Provider(s): [facility]Reason for Treatment: Not Provided Does patient have an ACCT team?: No Does patient have Intensive In-House Services?  : No Does patient have [facility] services? : No Does patient have [facility] services?: No  No current facility-administered medications for this encounter.   Current Outpatient Prescriptions  Medication Sig Dispense Refill  .  atorvastatin (LIPITOR) 20 MG tablet Take 1 tablet by mouth daily.    . carvedilol (COREG) 6.25 MG tablet Take 6.25 mg by mouth 2 (two) times daily with a meal.    . diclofenac sodium (VOLTAREN) 1 % GEL Apply 2 g topically 2 (two) times daily.    .Marland Kitchengabapentin (NEURONTIN) 300 MG capsule Take 900 mg by mouth 4 (four) times daily.    .Marland Kitchenlisinopril (PRINIVIL,ZESTRIL) 20 MG tablet Take 20 mg by mouth daily.    . meloxicam (MOBIC) 15 MG tablet Take 1 tablet (15 mg total) by mouth daily. 30 tablet 0  . naproxen (NAPROSYN) 500 MG tablet Take 1 tablet (500 mg total) by mouth 2 (two) times daily with a meal. 60 tablet 2  . oxyCODONE-acetaminophen (PERCOCET/ROXICET) 5-325 MG per tablet Take 1 tablet by mouth every 4 (four) hours as needed for severe pain. 8 tablet 0  . traMADol (ULTRAM) 50 MG tablet Take 1 tablet (50 mg total) by mouth every 6 (six) hours as needed. 9 tablet 0  . traMADol (ULTRAM) 50 MG tablet Take 1 tablet (50 mg total) by mouth every 6 (six) hours as needed. 20 tablet 0    Musculoskeletal: Strength & Muscle Tone:  within normal limits Gait & Station: not assessed due to fatigue Patient leans: N/A  Psychiatric Specialty Exam: Physical Exam  Review of Systems  Constitutional: Negative.   Eyes: Negative.   Respiratory: Negative.   Cardiovascular: Negative.   Gastrointestinal: Negative.   Genitourinary: Negative.   Musculoskeletal: Negative.   Skin: Negative.   Neurological: Negative.   Endo/Heme/Allergies: Negative.   Psychiatric/Behavioral: Positive for depression and substance abuse. Negative for suicidal ideas, hallucinations and memory loss. The patient has insomnia. The patient is not nervous/anxious.     Blood pressure 116/81, pulse 85, temperature 98.2 F (36.8 C), temperature source Oral, resp. rate 20, height 6' (1.829 m), weight 77.111 kg (170 lb), SpO2 97 %.Body mass index is 23.05 kg/(m^2).  General Appearance: Negative, Casual and Neat  Eye Contact::  Good  Speech:   Slow  Volume:  Decreased  Mood:  Hopeless and Worthless  Affect:  Blunt  Thought Process:  Coherent, Goal Directed, Intact and Logical  Orientation:  Full (Time, Place, and Person)  Thought Content:  Negative  Suicidal Thoughts:  No  Homicidal Thoughts:  No  Memory:  Immediate;   Fair Recent;   Fair Remote;   Fair  Judgement:  Poor  Insight:  Fair  Psychomotor Activity:  Decreased  Concentration:  Fair  Recall:  Unable to understand patient  Fund of Knowledge:Fair  Language: Fair  Akathisia:  Negative  Handed:  Right  AIMS (if indicated):     Assets:  Desire for Improvement Social Support  ADL's:  Intact  Cognition: WNL  Sleep:  Stable and adequate   Medical Decision Making: Review of Psycho-Social Stressors (1) and Review or order clinical lab tests (1)  Treatment Plan Summary: Plan admission to inpatient psychiatry  Plan:  Recommend psychiatric Inpatient admission when medically cleared. Disposition: see above  Donita Brooks 08/24/2015 10:06 AM

## 2015-08-24 NOTE — ED Notes (Signed)
Pt resting in bed at this time. NAD noted. Respirations even and unlabored.

## 2015-08-24 NOTE — ED Provider Notes (Signed)
[facility] Emergency Department Provider Note  ____________________________________________  Time seen: Approximately 3:12 AM  I have reviewed the triage vital signs and the nursing notes.   HISTORY  Chief Complaint Suicidal; Depression; and Addiction Problem    HPI Nicholas Delgado is a 34 y.o. male who presents to the ED from home with a chief complaint of depression with suicidal ideation. Patient states he is addicted to open urinated pain medicines which she gets on the street. Last week he attempted suicide by overdosing on pain pills and drinking antifreeze. Complains of depression; currently no plan for suicide. Complains of chronic pain "all over", and feels like he will withdrawal at any time. Last opiate use prior to arrival. Denies fever, chills, chest pain, shortness of breath, abdominal pain, nausea, vomiting, diarrhea.   Past Medical History  Diagnosis Date  . Hypertension     There are no active problems to display for this patient.   Past Surgical History  Procedure Laterality Date  . Arm surgery Left     Current Outpatient Rx  Name  Route  Sig  Dispense  Refill  . atorvastatin (LIPITOR) 20 MG tablet   Oral   Take 1 tablet by mouth daily.         . carvedilol (COREG) 6.25 MG tablet   Oral   Take 6.25 mg by mouth 2 (two) times daily with a meal.         . diclofenac sodium (VOLTAREN) 1 % GEL   Topical   Apply 2 g topically 2 (two) times daily.         Marland Kitchen gabapentin (NEURONTIN) 300 MG capsule   Oral   Take 900 mg by mouth 4 (four) times daily.         Marland Kitchen lisinopril (PRINIVIL,ZESTRIL) 20 MG tablet   Oral   Take 20 mg by mouth daily.         . meloxicam (MOBIC) 15 MG tablet   Oral   Take 1 tablet (15 mg total) by mouth daily.   30 tablet   0   . naproxen (NAPROSYN) 500 MG tablet   Oral   Take 1 tablet (500 mg total) by mouth 2 (two) times daily with a meal.   60 tablet   2   . oxyCODONE-acetaminophen  (PERCOCET/ROXICET) 5-325 MG per tablet   Oral   Take 1 tablet by mouth every 4 (four) hours as needed for severe pain.   8 tablet   0   . traMADol (ULTRAM) 50 MG tablet   Oral   Take 1 tablet (50 mg total) by mouth every 6 (six) hours as needed.   9 tablet   0   . traMADol (ULTRAM) 50 MG tablet   Oral   Take 1 tablet (50 mg total) by mouth every 6 (six) hours as needed.   20 tablet   0     Allergies Sulfa antibiotics  History reviewed. No pertinent family history.  Social History Social History  Substance Use Topics  . Smoking status: Current Every Day Smoker -- 2.00 packs/day  . Smokeless tobacco: None  . Alcohol Use: No    Review of Systems Constitutional: No fever/chills Eyes: No visual changes. ENT: No sore throat. Cardiovascular: Denies chest pain. Respiratory: Denies shortness of breath. Gastrointestinal: No abdominal pain.  No nausea, no vomiting.  No diarrhea.  No constipation. Genitourinary: Negative for dysuria. Musculoskeletal: Negative for back pain. Skin: Negative for rash. Neurological: Negative for headaches,  focal weakness or numbness. Psychiatric:Positive for depression with suicidal ideation.  10-point ROS otherwise negative.  ____________________________________________   PHYSICAL EXAM:  VITAL SIGNS: ED Triage Vitals  Enc Vitals Group     BP 08/24/15 0036 147/97 mmHg     Pulse Rate 08/24/15 0036 94     Resp 08/24/15 0036 20     Temp 08/24/15 0036 98.2 F (36.8 C)     Temp Source 08/24/15 0036 Oral     SpO2 08/24/15 0036 97 %     Weight 08/24/15 0036 170 lb (77.111 kg)     Height 08/24/15 0036 6' (1.829 m)     Head Cir --      Peak Flow --      Pain Score 08/24/15 0037 8     Pain Loc --      Pain Edu? --      Excl. in GC? --     Constitutional: Alert and oriented. Well appearing and in no acute distress. Eyes: Conjunctivae are normal. PERRL. EOMI. Head: Atraumatic. Nose: No congestion/rhinnorhea. Mouth/Throat: Mucous  membranes are moist.  Oropharynx non-erythematous. Neck: No stridor.   Cardiovascular: Normal rate, regular rhythm. Grossly normal heart sounds.  Good peripheral circulation. Respiratory: Normal respiratory effort.  No retractions. Lungs CTAB. Gastrointestinal: Soft and nontender. No distention. No abdominal bruits. No CVA tenderness. Musculoskeletal: No lower extremity tenderness nor edema.  No joint effusions. Neurologic:  Normal speech and language. No gross focal neurologic deficits are appreciated. No gait instability. Skin:  Skin is warm, dry and intact. No rash noted. Psychiatric: Mood and affect are flat. Speech and behavior are normal.  ____________________________________________   LABS (all labs ordered are listed, but only abnormal results are displayed)  Labs Reviewed  COMPREHENSIVE METABOLIC PANEL - Abnormal; Notable for the following:    Glucose, Bld 127 (*)    Calcium 8.6 (*)    ALT 14 (*)    All other components within normal limits  CBC - Abnormal; Notable for the following:    RBC 4.15 (*)    HCT 37.2 (*)    All other components within normal limits  URINE DRUG SCREEN, QUALITATIVE ([facility] ONLY) - Abnormal; Notable for the following:    Opiate, Ur Screen POSITIVE (*)    Benzodiazepine, Ur Scrn POSITIVE (*)    All other components within normal limits  ACETAMINOPHEN LEVEL - Abnormal; Notable for the following:    Acetaminophen (Tylenol), Serum <10 (*)    All other components within normal limits  ETHANOL  SALICYLATE LEVEL  ACETAMINOPHEN LEVEL  ACETAMINOPHEN LEVEL  SALICYLATE LEVEL  SALICYLATE LEVEL   ____________________________________________  EKG  None ____________________________________________  RADIOLOGY  None ____________________________________________   PROCEDURES  Procedure(s) performed: None  Critical Care performed: No  ____________________________________________   INITIAL IMPRESSION / ASSESSMENT AND PLAN / ED  COURSE  Pertinent labs & imaging results that were available during my care of the patient were reviewed by me and considered in my medical decision making (see chart for details).  34 year old male who presents with depression, opiate addiction with suicide attempt last week. Will Place under IVC for patient safety. Will consult TTS and psychiatry to evaluate patient in the emergency department. Will repeat 4 hour acetaminophen and salicylate levels.  ----------------------------------------- 6:28 AM on 08/24/2015 -----------------------------------------  Patient resting in no acute distress. Repeat acetaminophen and salicylate levels within normal limits. Patient is medically cleared pending psychiatry consult this morning. ____________________________________________   FINAL CLINICAL IMPRESSION(S) / ED DIAGNOSES  Final diagnoses:  Depression      Irean Hong, MD 08/24/15 [phone]

## 2015-08-25 DIAGNOSIS — F17200 Nicotine dependence, unspecified, uncomplicated: Secondary | ICD-10-CM

## 2015-08-25 DIAGNOSIS — I10 Essential (primary) hypertension: Secondary | ICD-10-CM

## 2015-08-25 DIAGNOSIS — E785 Hyperlipidemia, unspecified: Secondary | ICD-10-CM

## 2015-08-25 DIAGNOSIS — F3289 Other specified depressive episodes: Secondary | ICD-10-CM

## 2015-08-25 DIAGNOSIS — F1124 Opioid dependence with opioid-induced mood disorder: Secondary | ICD-10-CM

## 2015-08-25 DIAGNOSIS — F1193 Opioid use, unspecified with withdrawal: Secondary | ICD-10-CM

## 2015-08-25 DIAGNOSIS — F1123 Opioid dependence with withdrawal: Secondary | ICD-10-CM

## 2015-08-25 DIAGNOSIS — F1194 Opioid use, unspecified with opioid-induced mood disorder: Secondary | ICD-10-CM

## 2015-08-25 MED ORDER — TRAZODONE HCL 50 MG PO TABS
150.0000 mg | ORAL_TABLET | Freq: Every day | ORAL | Status: DC
  Administered 2015-08-25: 150 mg via ORAL
  Filled 2015-08-25: qty 1

## 2015-08-25 MED ORDER — CYCLOBENZAPRINE HCL 10 MG PO TABS
10.0000 mg | ORAL_TABLET | Freq: Three times a day (TID) | ORAL | Status: DC
  Administered 2015-08-25 – 2015-08-26 (×4): 10 mg via ORAL
  Filled 2015-08-25 (×4): qty 1

## 2015-08-25 MED ORDER — HYDROXYZINE HCL 50 MG PO TABS
50.0000 mg | ORAL_TABLET | Freq: Three times a day (TID) | ORAL | Status: DC
  Administered 2015-08-25 – 2015-08-26 (×5): 50 mg via ORAL
  Filled 2015-08-25 (×4): qty 1

## 2015-08-25 NOTE — Progress Notes (Signed)
Patient alert and oriented x4 today. Affect flat and mood has been depressed but does brighten up on approach and has been visible in dayroom and interacting with peers.  Medication and group compliant.  Denies any withdrawal.  Q 15 min checks in place for safety.  Will cont to monitor.  

## 2015-08-25 NOTE — Progress Notes (Signed)
Recreation Therapy Notes  Date: 09.05.16 Time: 3:00 pm Location: Craft Room  Group Topic: Wellness  Goal Area(s) Addresses:  Patient will identify at least one item per dimension of health. Patient will examine areas they are deficient in.  Behavioral Response: Did not attend  Intervention: 6 Dimensions of Health  Activity: Patients were given a worksheet with the definitions of the 6 dimensions of health. Patients were given a worksheet with the 6 dimensions on it and instructed to list 2-3 things per each item.   Education: LRT educated patients on how the activity was related to their admission and d/c.   Education Outcome: Patient did not attend group.  Clinical Observations/Feedback: Patient did not attend group.  Jacquelynn Cree, LRT/CTRS 08/25/2015 4:14 PM

## 2015-08-25 NOTE — Tx Team (Signed)
Initial Interdisciplinary Treatment Plan   PATIENT STRESSORS: Substance abuse, financial concerns   PATIENT STRENGTHS: He is able to communicate his needs. He recognizes he has a substance abuse problem.    PROBLEM LIST: Problem List/Patient Goals Date to be addressed Date deferred Reason deferred Estimated date of resolution  Substance Abuse 08/24/2015   09/14/2015  Depression 08/24/2015   09/14/2015                                             DISCHARGE CRITERIA:  Patient has completed detox and symptoms stabilization. Mood will improve and patient will be able to voice three positive coping skills.  PRELIMINARY DISCHARGE PLAN: To return back home with his mother and child.  PATIENT/FAMIILY INVOLVEMENT: This treatment plan has been presented to and reviewed with the patient, Nicholas Delgado, and/or family member.  The patient and family have been given the opportunity to ask questions and make suggestions.  Journey Ratterman A Arwilda [place] 08/25/2015, 1:57 AM

## 2015-08-25 NOTE — Plan of Care (Signed)
Problem: Ineffective individual coping Goal: STG: Patient will remain free from self harm Outcome: Progressing Patient denies any suicidal thoughts and he is able to contract for safety and he agrees to come to staff if he is having thoughts of wanting to hurt himself or others.

## 2015-08-25 NOTE — Progress Notes (Signed)
Patient has been in the day room with staff and peers. Alert and oriented, pleasant and interacting appropriately with staff and peers. Patient has just had his medications and had no concern. Support and encouragements offered. Safety precautions maintained.

## 2015-08-25 NOTE — [facility] Suicide Risk Assessment (Signed)
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 Tobacco use disorder [Z72.0] 08/25/2015  . Opioid use with withdrawal [F11.93] 08/25/2015  . 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 HTN (hypertension) [I10] 08/25/2015  . Dyslipidemia [E78.5] 08/25/2015  . Opioid use disorder, severe, dependence [F11.20] 08/24/2015     Continued Clinical Symptoms:  Alcohol Use Disorder Identification Test Final Score (AUDIT): 0 The "Alcohol Use Disorders Identification Test", Guidelines for Use in Primary Care, Second Edition.  World Science writer [facility]). Score between 0-7:  no or low risk or alcohol related problems. Score between 8-15:  moderate risk of alcohol related problems. Score between 16-19:  high risk of alcohol related problems. Score 20 or above:  warrants further diagnostic evaluation for alcohol dependence and treatment.   CLINICAL FACTORS:   Severe Anxiety and/or Agitation Depression:   Comorbid alcohol abuse/dependence Impulsivity Alcohol/Substance Abuse/Dependencies Previous Psychiatric Diagnoses and Treatments   Psychiatric Specialty Exam: Physical Exam  ROS   COGNITIVE FEATURES THAT CONTRIBUTE TO RISK:  None    SUICIDE RISK:   Moderate:  Frequent suicidal ideation with limited intensity, and duration, some specificity in terms of plans, no associated intent, good self-control, limited dysphoria/symptomatology, some risk factors present, and identifiable protective factors, including available and accessible social support.  PLAN OF CARE: admit to [facility]  Medical Decision Making:   Established Problem, Stable/Improving (1)  I certify that inpatient services furnished can reasonably be expected to improve the patient's condition.   Jimmy Footman 08/25/2015, 10:05 AM

## 2015-08-25 NOTE — [facility] Group Notes (Addendum)
[facility] LCSW Aftercare Discharge Planning Group Note   08/25/2015 12:01 PM  Participation Quality:  Patient did not attend group   Lulu Riding, MSW, [employer]

## 2015-08-25 NOTE — Progress Notes (Signed)
Nicholas Delgado admitted to the unit for Opiate abuse he reports using 100 mg of Percocet daily from the streets and presciptions. He reports wanting detox. He is O x 4, sad, he reports being depressed but he denies suicidal ideation, he also denies hallucinations. He was cooperative with treatment he complained of some w/d symptoms but he is resting in bed eyes closed at this time.

## 2015-08-25 NOTE — Progress Notes (Signed)
Recreation Therapy Notes  INPATIENT RECREATION THERAPY ASSESSMENT  Patient Details Name: Nicholas Delgado MRN: [id number] DOB: [birth year] Today's Date: 08/25/2015  Patient Stressors: Family, Relationship, Death, Work, Other (Comment) (Family-15-30% good relationship with, rough match iwth wife, aunt passed on 04-May-2023 and wasn't able to go to funeral, illegal job, everything)  Coping Skills:   Arguments, Substance Abuse, Music, Sports  Personal Challenges: Anger, Concentration, Decision-Making, Problem-Solving, Relationships, Self-Esteem/Confidence, Social Interaction, Stress Management, Substance Abuse, Time Management, Trusting Others, Work Nutritional therapist (2+):  Individual - Other (Comment) ("Do drugs, do more drugs")  Awareness of Community Resources:  Yes  Community Resources:  [employer], [place]  Current Use: No  If no, Barriers?: Other (Comment) (No time)  Patient Strengths:  2 sons  Patient Identified Areas of Improvement:  Everything  Current Recreation Participation:  Drugs  Patient Goal for Hospitalization:  "I don't know"  [place] of Residence:  [place] of Residence:  [place]   Current SI (including self-harm):  No  Current HI:  No  Consent to Intern Participation: N/A  LRT will not develop a Recreational Therapy Care Plan at this time due to patient initially refusing. Patient requested for LRT to check with him tomorrow. LRT will see if patient's status changes tomorrow.   Jacquelynn Cree, LRT/CTRS 08/25/2015, 4:50 PM

## 2015-08-25 NOTE — [facility] Group Notes (Signed)
[facility] LCSW Group Therapy  08/25/2015 2:53 PM  Type of Therapy:  Group Therapy  Participation Level:  Active  Participation Quality:  Appropriate and Attentive  Affect:  Appropriate  Cognitive:  Alert, Appropriate and Oriented  Insight:  Improving  Engagement in Therapy:  Engaged  Modes of Intervention:  Discussion, Socialization and Support  Summary of Progress/Problems: Patient arrived 10 minutes late for group but was appropriately participating and interacting with other group members. Patient shared personal experiences in his struggle with addiction and how it has cause legal issues in his life.   Lulu Riding, MSW, LCSWA 08/25/2015, 2:53 PM

## 2015-08-25 NOTE — H&P (Addendum)
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 Tobacco use disorder [Z72.0] 08/25/2015  . Opioid use with withdrawal [F11.93] 08/25/2015  . 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 HTN (hypertension) [I10] 08/25/2015  . Dyslipidemia [E78.5] 08/25/2015  . Opioid use disorder, severe, dependence [F11.20] 08/24/2015   History of Present Illness:  Nicholas Delgado is a 34 y.o. male patient admitted with a h/o depression who presents to [facility] ED for worsening depression with SI in the setting of opioid withdrawal. He attempted suicide last week by overdosing on pain pills and drinking windshield cleaner. He shared with Dr. Beather Arbour in the ED that he is addicted to opiate pain medications and is tired of being sick with withdrawals all the time.  Pt reports using opiates 174m QID for the past 15 years (no IV use). He endorses the following symptoms of withdrawal including diarrhea, sweats, tachycardia, chills, tremors, nausea, cramps. Pt shared he attempted to overdose on BP pill and consuming windshield washer fluid a few weeks ago. He was trying to kill himself.     His UDS is positive for opiates and benzodiazepines but he denies the use of benzodiazepines or any other drugs other than opioids.  He also denies the abuse of alcohol.  Mr. FShousealso reports a long history of depression that started in his adolescence. Reports that he has been hospitalized here a multitude of times before.  He is currently experiencing opiate withdrawals including nausea, diarrhea, chills and muscle cramps. Pt endorses feeling hopeless, helpless and worthless. He shared his sleep is stable with decreased  energy, concentration and appetite. He denies anhedonia and guilt. He currently denies SI, HI and AVH. There is a questionable h/o mania when the pt was not using drugs.  HPI: See above HPI Elements: Location: see HPI. Quality: worsening. Severity: severe. Timing: the past 3 weeks. Duration: at least 15 years. Context: opiate withdrawal.  Total Time spent with patient: 1 hour  Past Psychiatric History: Patient reported prior hospitalizations to our unit however he states that he only states that one or 2 days as he usually requests discharge quickly wanted withdrawal [severe. He denies any history of prior suicidal attempts or that his suicidal attempt couple of weeks ago. He denies history of self-injurious behaviors. Reports being diagnosed in the past with depression and anxiety and states that this started around the age of 89 At that time he was seen by psychiatrist who treated him with some of them Prozac.  Currently he is not receiving any psychiatric care and is not currently in any psychothropic  medication.  Past Medical History: Dyslipidemia and hypertension.  He also complains of having past fractures in his left arm, right hand and right foot. Denies any history of seizures denies any history of trauma. Past Medical History  Diagnosis Date  . Hypertension     Past Surgical History  Procedure Laterality Date  . Arm surgery Left    Family History: Patient reports that his mother suffers from depression. Denies substance abuse is his family. Denies suicidal attempts in his family.  Social History: Patient is currently unemployed and has been out of work for 6 months. In the past he has done jobs cutting trees. Currently he is living with his mother  in [place]. He separated from his wife. He has 2 biological children ages 24 and 77 and raised two other kids.Marland Kitchen He reports having contact with his children. The patient spends large amounts of money in opiates which he said  his getting by "hustling".  As far as his education he completed 1 semester in college. As far as his legal history he reports that he is had trouble with the law in the past for larceny charges. Denies having any charges pending at this time. He has been in jail in the past. His longest incarceration has been 3 days. History  Alcohol Use No     History  Drug Use  . Yes    Comment: percocet 55m    Social History   Social History  . Marital Status: Legally Separated    Spouse Name: N/A  . Number of Children: N/A  . Years of Education: N/A   Social History Main Topics  . Smoking status: Current Every Day Smoker -- 2.00 packs/day  . Smokeless tobacco: None  . Alcohol Use: No  . Drug Use: Yes     Comment: percocet 175m . Sexual Activity: Yes   Other Topics Concern  . None   Social History Narrative    Musculoskeletal: Strength & Muscle Tone: within normal limits Gait & Station: normal Patient leans: N/A  Psychiatric Specialty Exam: Physical Exam  Review of Systems  Eyes: Negative.   Respiratory: Negative.   Cardiovascular: Negative.   Gastrointestinal: Positive for diarrhea.  Genitourinary: Negative.   Musculoskeletal: Positive for myalgias.  Skin: Negative.   Neurological: Positive for weakness and headaches.  Psychiatric/Behavioral: Positive for depression and substance abuse. Negative for suicidal ideas and hallucinations. The patient is nervous/anxious and has insomnia.     Blood pressure 143/99, pulse 71, temperature 98.4 F (36.9 C), temperature source Oral, resp. rate 20, height 6' (1.829 m), weight 77.111 kg (170 lb).Body mass index is 23.05 kg/(m^2).  General Appearance: Fairly Groomed  EyEngineer, water  Good  Speech:  Clear and Coherent  Volume:  Normal  Mood:  Anxious and Dysphoric  Affect:  Congruent  Thought Process:  Linear  Orientation:  Full (Time, Place, and Person)  Thought Content:  Hallucinations: None  Suicidal Thoughts:  No  Homicidal  Thoughts:  No  Memory:  Immediate;   Good Recent;   Good Remote;   Good  Judgement:  Poor  Insight:  Shallow  Psychomotor Activity:  Increased  Concentration:  NA  Recall:  NA  Fund of Knowledge:Good  Language: Good  Akathisia:  No  Handed:    AIMS (if indicated):     Assets:  CoChief Executive Officerhysical Health Social Support  ADL's:  Intact  Cognition: WNL  Sleep:      Physical examination per emergency department Constitutional: Alert and oriented. Well appearing and in no acute distress. Eyes: Conjunctivae are normal. PERRL. EOMI. Head: Atraumatic. Nose: No congestion/rhinnorhea. Mouth/Throat: Mucous membranes are moist. Oropharynx non-erythematous. Neck: No stridor.  Cardiovascular: Normal rate, regular rhythm. Grossly normal heart sounds. Good peripheral circulation. Respiratory: Normal respiratory effort. No retractions. Lungs CTAB. Gastrointestinal: Soft and nontender. No distention. No abdominal bruits. No CVA tenderness. Musculoskeletal: No lower extremity tenderness nor edema. No joint effusions. Neurologic: Normal speech and language. No gross focal neurologic deficits are appreciated. No gait instability. Skin: Skin is warm, dry and intact. No rash noted. Psychiatric: Mood and affect are flat. Speech and behavior are normal.  Allergies:   Allergies  Allergen Reactions  .  Sulfa Antibiotics Shortness Of Breath   Lab Results:  Results for orders placed or performed during the hospital encounter of 08/24/15 (from the past 48 hour(s))  Comprehensive metabolic panel     Status: Abnormal   Collection Time: 08/24/15 12:46 AM  Result Value Ref Range   Sodium 138 135 - 145 mmol/L   Potassium 3.8 3.5 - 5.1 mmol/L   Chloride 106 101 - 111 mmol/L   CO2 26 22 - 32 mmol/L   Glucose, Bld 127 (H) 65 - 99 mg/dL   BUN 18 6 - 20 mg/dL   Creatinine, Ser 0.63 0.61 - 1.24 mg/dL   Calcium 8.6 (L) 8.9 - 10.3 mg/dL   Total Protein 6.5 6.5 - 8.1 g/dL    Albumin 3.8 3.5 - 5.0 g/dL   AST 28 15 - 41 U/L   ALT 14 (L) 17 - 63 U/L   Alkaline Phosphatase 90 38 - 126 U/L   Total Bilirubin 0.7 0.3 - 1.2 mg/dL   GFR calc non Af Amer >60 >60 mL/min   GFR calc Af Amer >60 >60 mL/min    Comment: (NOTE) The eGFR has been calculated using the CKD EPI equation. This calculation has not been validated in all clinical situations. eGFR's persistently <60 mL/min signify possible Chronic Kidney Disease.    Anion gap 6 5 - 15  Ethanol (ETOH)     Status: None   Collection Time: 08/24/15 12:46 AM  Result Value Ref Range   Alcohol, Ethyl (B) <5 <5 mg/dL    Comment:        LOWEST DETECTABLE LIMIT FOR SERUM ALCOHOL IS 5 mg/dL FOR MEDICAL PURPOSES ONLY   Salicylate level     Status: None   Collection Time: 08/24/15 12:46 AM  Result Value Ref Range   Salicylate Lvl 5.2 2.8 - 30.0 mg/dL  Acetaminophen level     Status: None   Collection Time: 08/24/15 12:46 AM  Result Value Ref Range   Acetaminophen (Tylenol), Serum 14 10 - 30 ug/mL    Comment:        THERAPEUTIC CONCENTRATIONS VARY SIGNIFICANTLY. A RANGE OF 10-30 ug/mL MAY BE AN EFFECTIVE CONCENTRATION FOR MANY PATIENTS. HOWEVER, SOME ARE BEST TREATED AT CONCENTRATIONS OUTSIDE THIS RANGE. ACETAMINOPHEN CONCENTRATIONS >150 ug/mL AT 4 HOURS AFTER INGESTION AND >50 ug/mL AT 12 HOURS AFTER INGESTION ARE OFTEN ASSOCIATED WITH TOXIC REACTIONS.   CBC     Status: Abnormal   Collection Time: 08/24/15 12:46 AM  Result Value Ref Range   WBC 7.7 3.8 - 10.6 K/uL   RBC 4.15 (L) 4.40 - 5.90 MIL/uL   Hemoglobin 13.2 13.0 - 18.0 g/dL   HCT 37.2 (L) 40.0 - 52.0 %   MCV 89.6 80.0 - 100.0 fL   MCH 31.8 26.0 - 34.0 pg   MCHC 35.5 32.0 - 36.0 g/dL   RDW 13.5 11.5 - 14.5 %   Platelets 171 150 - 440 K/uL  Urine Drug Screen, Qualitative ([facility] only)     Status: Abnormal   Collection Time: 08/24/15 12:46 AM  Result Value Ref Range   Tricyclic, Ur Screen NONE DETECTED NONE DETECTED   Amphetamines, Ur Screen  NONE DETECTED NONE DETECTED   MDMA (Ecstasy)Ur Screen NONE DETECTED NONE DETECTED   Cocaine Metabolite,Ur [place] NONE DETECTED NONE DETECTED   Opiate, Ur Screen POSITIVE (A) NONE DETECTED   Phencyclidine (PCP) Ur S NONE DETECTED NONE DETECTED   Cannabinoid 50 Ng, Ur [place] NONE DETECTED NONE DETECTED   Barbiturates, Ur Screen  NONE DETECTED NONE DETECTED   Benzodiazepine, Ur Scrn POSITIVE (A) NONE DETECTED   Methadone Scn, Ur NONE DETECTED NONE DETECTED    Comment: (NOTE) 245  Tricyclics, urine               Cutoff 1000 ng/mL 200  Amphetamines, urine             Cutoff 1000 ng/mL 300  MDMA (Ecstasy), urine           Cutoff 500 ng/mL 400  Cocaine Metabolite, urine       Cutoff 300 ng/mL 500  Opiate, urine                   Cutoff 300 ng/mL 600  Phencyclidine (PCP), urine      Cutoff 25 ng/mL 700  Cannabinoid, urine              Cutoff 50 ng/mL 800  Barbiturates, urine             Cutoff 200 ng/mL 900  Benzodiazepine, urine           Cutoff 200 ng/mL 1000 Methadone, urine                Cutoff 300 ng/mL 1100 1200 The urine drug screen provides only a preliminary, unconfirmed 1300 analytical test result and should not be used for non-medical 1400 purposes. Clinical consideration and professional judgment should 1500 be applied to any positive drug screen result due to possible 1600 interfering substances. A more specific alternate chemical method 1700 must be used in order to obtain a confirmed analytical result.  1800 Gas chromato graphy / mass spectrometry (GC/MS) is the preferred 1900 confirmatory method.   Acetaminophen level     Status: None   Collection Time: 08/24/15 12:46 AM  Result Value Ref Range   Acetaminophen (Tylenol), Serum 14 10 - 30 ug/mL    Comment:        THERAPEUTIC CONCENTRATIONS VARY SIGNIFICANTLY. A RANGE OF 10-30 ug/mL MAY BE AN EFFECTIVE CONCENTRATION FOR MANY PATIENTS. HOWEVER, SOME ARE BEST TREATED AT CONCENTRATIONS OUTSIDE THIS RANGE. ACETAMINOPHEN  CONCENTRATIONS >150 ug/mL AT 4 HOURS AFTER INGESTION AND >50 ug/mL AT 12 HOURS AFTER INGESTION ARE OFTEN ASSOCIATED WITH TOXIC REACTIONS.   Salicylate level     Status: None   Collection Time: 08/24/15 12:46 AM  Result Value Ref Range   Salicylate Lvl 4.9 2.8 - 30.0 mg/dL  Acetaminophen level     Status: Abnormal   Collection Time: 08/24/15  5:01 AM  Result Value Ref Range   Acetaminophen (Tylenol), Serum <10 (L) 10 - 30 ug/mL    Comment:        THERAPEUTIC CONCENTRATIONS VARY SIGNIFICANTLY. A RANGE OF 10-30 ug/mL MAY BE AN EFFECTIVE CONCENTRATION FOR MANY PATIENTS. HOWEVER, SOME ARE BEST TREATED AT CONCENTRATIONS OUTSIDE THIS RANGE. ACETAMINOPHEN CONCENTRATIONS >150 ug/mL AT 4 HOURS AFTER INGESTION AND >50 ug/mL AT 12 HOURS AFTER INGESTION ARE OFTEN ASSOCIATED WITH TOXIC REACTIONS.   Salicylate level     Status: None   Collection Time: 08/24/15  5:01 AM  Result Value Ref Range   Salicylate Lvl 6.6 2.8 - 30.0 mg/dL   Current Medications: Current Facility-Administered Medications  Medication Dose Route Frequency Provider Last Rate Last Dose  . acetaminophen (TYLENOL) tablet 650 mg  650 mg Oral Q6H PRN Hildred Priest, MD   650 mg at 08/24/15 2325  . alum & mag hydroxide-simeth (MAALOX/MYLANTA) 200-200-20 MG/5ML suspension 30 mL  30 mL Oral Q4H  PRN Hildred Priest, MD      . atorvastatin (LIPITOR) tablet 20 mg  20 mg Oral QHS Hildred Priest, MD   20 mg at 08/24/15 2325  . carvedilol (COREG) tablet 6.25 mg  6.25 mg Oral BID WC Hildred Priest, MD   6.25 mg at 08/25/15 0841  . cyclobenzaprine (FLEXERIL) tablet 10 mg  10 mg Oral TID Hildred Priest, MD      . diclofenac sodium (VOLTAREN) 1 % transdermal gel 2 g  2 g Topical BID Hildred Priest, MD   2 g at 08/25/15 0839  . gabapentin (NEURONTIN) capsule 900 mg  900 mg Oral QID Hildred Priest, MD   900 mg at 08/25/15 0840  . hydrOXYzine (ATARAX/VISTARIL)  tablet 50 mg  50 mg Oral TID PC & HS Hildred Priest, MD      . lisinopril (PRINIVIL,ZESTRIL) tablet 20 mg  20 mg Oral Daily Hildred Priest, MD   20 mg at 08/25/15 0840  . loperamide (IMODIUM) capsule 2 mg  2 mg Oral TID Hildred Priest, MD   2 mg at 08/25/15 0841  . magnesium hydroxide (MILK OF MAGNESIA) suspension 30 mL  30 mL Oral Daily PRN Hildred Priest, MD      . meloxicam (MOBIC) tablet 15 mg  15 mg Oral Daily Hildred Priest, MD   15 mg at 08/25/15 0840  . nicotine (NICODERM CQ - dosed in mg/24 hours) patch 21 mg  21 mg Transdermal Q0600 Hildred Priest, MD   21 mg at 08/25/15 0631  . promethazine (PHENERGAN) tablet 12.5 mg  12.5 mg Oral TID Hildred Priest, MD   12.5 mg at 08/25/15 0841  . traZODone (DESYREL) tablet 150 mg  150 mg Oral QHS Hildred Priest, MD       PTA Medications: Prescriptions prior to admission  Medication Sig Dispense Refill Last Dose  . atorvastatin (LIPITOR) 20 MG tablet Take 1 tablet by mouth daily.     . carvedilol (COREG) 6.25 MG tablet Take 6.25 mg by mouth 2 (two) times daily with a meal.     . diclofenac sodium (VOLTAREN) 1 % GEL Apply 2 g topically 2 (two) times daily.     Marland Kitchen gabapentin (NEURONTIN) 300 MG capsule Take 900 mg by mouth 4 (four) times daily.     Marland Kitchen lisinopril (PRINIVIL,ZESTRIL) 20 MG tablet Take 20 mg by mouth daily.     . meloxicam (MOBIC) 15 MG tablet Take 1 tablet (15 mg total) by mouth daily. 30 tablet 0   . naproxen (NAPROSYN) 500 MG tablet Take 1 tablet (500 mg total) by mouth 2 (two) times daily with a meal. 60 tablet 2   . oxyCODONE-acetaminophen (PERCOCET/ROXICET) 5-325 MG per tablet Take 1 tablet by mouth every 4 (four) hours as needed for severe pain. 8 tablet 0   . traMADol (ULTRAM) 50 MG tablet Take 1 tablet (50 mg total) by mouth every 6 (six) hours as needed. 9 tablet 0   . traMADol (ULTRAM) 50 MG tablet Take 1 tablet (50 mg total) by mouth every 6  (six) hours as needed. 20 tablet 0      Results for orders placed or performed during the hospital encounter of 08/24/15 (from the past 72 hour(s))  Comprehensive metabolic panel     Status: Abnormal   Collection Time: 08/24/15 12:46 AM  Result Value Ref Range   Sodium 138 135 - 145 mmol/L   Potassium 3.8 3.5 - 5.1 mmol/L   Chloride 106 101 - 111 mmol/L  CO2 26 22 - 32 mmol/L   Glucose, Bld 127 (H) 65 - 99 mg/dL   BUN 18 6 - 20 mg/dL   Creatinine, Ser 0.63 0.61 - 1.24 mg/dL   Calcium 8.6 (L) 8.9 - 10.3 mg/dL   Total Protein 6.5 6.5 - 8.1 g/dL   Albumin 3.8 3.5 - 5.0 g/dL   AST 28 15 - 41 U/L   ALT 14 (L) 17 - 63 U/L   Alkaline Phosphatase 90 38 - 126 U/L   Total Bilirubin 0.7 0.3 - 1.2 mg/dL   GFR calc non Af Amer >60 >60 mL/min   GFR calc Af Amer >60 >60 mL/min    Comment: (NOTE) The eGFR has been calculated using the CKD EPI equation. This calculation has not been validated in all clinical situations. eGFR's persistently <60 mL/min signify possible Chronic Kidney Disease.    Anion gap 6 5 - 15  Ethanol (ETOH)     Status: None   Collection Time: 08/24/15 12:46 AM  Result Value Ref Range   Alcohol, Ethyl (B) <5 <5 mg/dL    Comment:        LOWEST DETECTABLE LIMIT FOR SERUM ALCOHOL IS 5 mg/dL FOR MEDICAL PURPOSES ONLY   Salicylate level     Status: None   Collection Time: 08/24/15 12:46 AM  Result Value Ref Range   Salicylate Lvl 5.2 2.8 - 30.0 mg/dL  Acetaminophen level     Status: None   Collection Time: 08/24/15 12:46 AM  Result Value Ref Range   Acetaminophen (Tylenol), Serum 14 10 - 30 ug/mL    Comment:        THERAPEUTIC CONCENTRATIONS VARY SIGNIFICANTLY. A RANGE OF 10-30 ug/mL MAY BE AN EFFECTIVE CONCENTRATION FOR MANY PATIENTS. HOWEVER, SOME ARE BEST TREATED AT CONCENTRATIONS OUTSIDE THIS RANGE. ACETAMINOPHEN CONCENTRATIONS >150 ug/mL AT 4 HOURS AFTER INGESTION AND >50 ug/mL AT 12 HOURS AFTER INGESTION ARE OFTEN ASSOCIATED WITH TOXIC REACTIONS.    CBC     Status: Abnormal   Collection Time: 08/24/15 12:46 AM  Result Value Ref Range   WBC 7.7 3.8 - 10.6 K/uL   RBC 4.15 (L) 4.40 - 5.90 MIL/uL   Hemoglobin 13.2 13.0 - 18.0 g/dL   HCT 37.2 (L) 40.0 - 52.0 %   MCV 89.6 80.0 - 100.0 fL   MCH 31.8 26.0 - 34.0 pg   MCHC 35.5 32.0 - 36.0 g/dL   RDW 13.5 11.5 - 14.5 %   Platelets 171 150 - 440 K/uL  Urine Drug Screen, Qualitative ([facility] only)     Status: Abnormal   Collection Time: 08/24/15 12:46 AM  Result Value Ref Range   Tricyclic, Ur Screen NONE DETECTED NONE DETECTED   Amphetamines, Ur Screen NONE DETECTED NONE DETECTED   MDMA (Ecstasy)Ur Screen NONE DETECTED NONE DETECTED   Cocaine Metabolite,Ur [place] NONE DETECTED NONE DETECTED   Opiate, Ur Screen POSITIVE (A) NONE DETECTED   Phencyclidine (PCP) Ur S NONE DETECTED NONE DETECTED   Cannabinoid 50 Ng, Ur [place] NONE DETECTED NONE DETECTED   Barbiturates, Ur Screen NONE DETECTED NONE DETECTED   Benzodiazepine, Ur Scrn POSITIVE (A) NONE DETECTED   Methadone Scn, Ur NONE DETECTED NONE DETECTED    Comment: (NOTE) 364  Tricyclics, urine               Cutoff 1000 ng/mL 200  Amphetamines, urine             Cutoff 1000 ng/mL 300  MDMA (Ecstasy), urine  Cutoff 500 ng/mL 400  Cocaine Metabolite, urine       Cutoff 300 ng/mL 500  Opiate, urine                   Cutoff 300 ng/mL 600  Phencyclidine (PCP), urine      Cutoff 25 ng/mL 700  Cannabinoid, urine              Cutoff 50 ng/mL 800  Barbiturates, urine             Cutoff 200 ng/mL 900  Benzodiazepine, urine           Cutoff 200 ng/mL 1000 Methadone, urine                Cutoff 300 ng/mL 1100 1200 The urine drug screen provides only a preliminary, unconfirmed 1300 analytical test result and should not be used for non-medical 1400 purposes. Clinical consideration and professional judgment should 1500 be applied to any positive drug screen result due to possible 1600 interfering substances. A more specific alternate chemical  method 1700 must be used in order to obtain a confirmed analytical result.  1800 Gas chromato graphy / mass spectrometry (GC/MS) is the preferred 1900 confirmatory method.   Acetaminophen level     Status: None   Collection Time: 08/24/15 12:46 AM  Result Value Ref Range   Acetaminophen (Tylenol), Serum 14 10 - 30 ug/mL    Comment:        THERAPEUTIC CONCENTRATIONS VARY SIGNIFICANTLY. A RANGE OF 10-30 ug/mL MAY BE AN EFFECTIVE CONCENTRATION FOR MANY PATIENTS. HOWEVER, SOME ARE BEST TREATED AT CONCENTRATIONS OUTSIDE THIS RANGE. ACETAMINOPHEN CONCENTRATIONS >150 ug/mL AT 4 HOURS AFTER INGESTION AND >50 ug/mL AT 12 HOURS AFTER INGESTION ARE OFTEN ASSOCIATED WITH TOXIC REACTIONS.   Salicylate level     Status: None   Collection Time: 08/24/15 12:46 AM  Result Value Ref Range   Salicylate Lvl 4.9 2.8 - 30.0 mg/dL  Acetaminophen level     Status: Abnormal   Collection Time: 08/24/15  5:01 AM  Result Value Ref Range   Acetaminophen (Tylenol), Serum <10 (L) 10 - 30 ug/mL    Comment:        THERAPEUTIC CONCENTRATIONS VARY SIGNIFICANTLY. A RANGE OF 10-30 ug/mL MAY BE AN EFFECTIVE CONCENTRATION FOR MANY PATIENTS. HOWEVER, SOME ARE BEST TREATED AT CONCENTRATIONS OUTSIDE THIS RANGE. ACETAMINOPHEN CONCENTRATIONS >150 ug/mL AT 4 HOURS AFTER INGESTION AND >50 ug/mL AT 12 HOURS AFTER INGESTION ARE OFTEN ASSOCIATED WITH TOXIC REACTIONS.   Salicylate level     Status: None   Collection Time: 08/24/15  5:01 AM  Result Value Ref Range   Salicylate Lvl 6.6 2.8 - 30.0 mg/dL    Treatment Plan Summary: Daily contact with patient to assess and evaluate symptoms and progress in treatment and Medication management   34 year old with past history of depression and anxiety who presented to our emergency Department reporting worsening depression and suicidality in the setting of opiate withdrawal.  Currently not receiving any psychiatric treatment.  Opiate-induced depressive disorder:  Patient admitted after voicing suicidality and worsening depression in the setting of withdrawal. It is unclear at this time whether the depressive symptoms are primary or secondary to the opiate use. We'll hold off on restarting antidepressives at this time as they can aggravate withdrawal symptoms.  Opiate withdrawal: I we'll offer symptomatic treatment with Imodium, Flexeril, Vistaril and Phenergan.  Insomnia: Continue trazodone at 150 mg by mouth daily at bedtime  Hypertension: Continue Coreg and lisinopril  Dyslipidemia: Continue Lipitor 20 mg a day.  Tobacco use disorder: Continue nicotine patch 21 mg.  Pain issues: Patient was prescribed with Neurontin, Voltaren gel, Mobic prior to admission. Will continue the patient on these medications. I suspect that the issues with pain are mostly related to opiate withdrawals and not a pain disorder.  Discharge planning: Patient is interested in intensive outpatient substance abuse and a referral to Suboxone treatment.   Medical Decision Making:  Established Problem, Stable/Improving (1)  I certify that inpatient services furnished can reasonably be expected to improve the patient's condition.   Hildred Priest 9/5/201610:05 AM

## 2015-08-26 MED ORDER — TRAZODONE HCL 150 MG PO TABS: 150.0000 mg | ORAL_TABLET | Freq: Every evening | ORAL | Status: DC | PRN

## 2015-08-26 NOTE — Progress Notes (Signed)
  [facility] Adult Case Management Discharge Plan :  Will you be returning to the same living situation after discharge:  Yes,    At discharge, do you have transportation home?: Yes,    Do you have the ability to pay for your medications: Yes,     Release of information consent forms completed and in the chart;  Patient's signature needed at discharge.  Patient to Follow up at:   Patient denies SI/HI: Yes,       Safety Planning and Suicide Prevention discussed: No.PT discharged without Social Worker knowing. Previous plan discussed over the weekend was to walk in at [employer] for [place]  Have you used any form of tobacco in the last 30 days? (Cigarettes, Smokeless Tobacco, Cigars, and/or Pipes): Yes  Has patient been referred to the Quitline?: Patient refused referral  Glennon Mac, MSW, LCSW 08/26/2015, 2:04 PM

## 2015-08-26 NOTE — [facility] Suicide Risk Assessment (Signed)
[facility] Discharge Suicide Risk Assessment   Demographic Factors:  Male, Caucasian and Unemployed  Total Time spent with patient: 30 minutes    Psychiatric Specialty Exam: Physical Exam  ROS                                                         Have you used any form of tobacco in the last 30 days? (Cigarettes, Smokeless Tobacco, Cigars, and/or Pipes): Yes  Has this patient used any form of tobacco in the last 30 days? (Cigarettes, Smokeless Tobacco, Cigars, and/or Pipes) Yes, A prescription for an FDA-approved tobacco cessation medication was offered at discharge and the patient refused  Mental Status Per Nursing Assessment::   On Admission:     Current Mental Status by Physician: Denies SI, HI or having auditory or visual hallucinations. There is no evidence of mania, hypomania or psychosis. The patient is hopeful and future oriented. Appears to be motivated for treatment. Mood is euthymic and affect are reactive. Has supportive family  Loss Factors: NA  Historical Factors: Prior suicide attempts and Impulsivity  Risk Reduction Factors:   Sense of responsibility to family, Living with another person, especially a relative and Positive social support  Continued Clinical Symptoms:  Alcohol/Substance Abuse/Dependencies Previous Psychiatric Diagnoses and Treatments  Cognitive Features That Contribute To Risk:  None    Suicide Risk:  Minimal: No identifiable suicidal ideation.  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 Tobacco use disorder [Z72.0] 08/25/2015  . Opioid use with withdrawal [F11.93] 08/25/2015  . 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 HTN (hypertension) [I10] 08/25/2015  . Dyslipidemia [E78.5] 08/25/2015  . Opioid use disorder, severe, dependence [F11.20] 08/24/2015      Is patient on multiple antipsychotic therapies at discharge:  No   Has Patient had three or more failed trials of antipsychotic monotherapy by history:  No  Recommended Plan for Multiple Antipsychotic Therapies: NA    Jimmy Footman 08/26/2015, 11:03 AM

## 2015-08-26 NOTE — Plan of Care (Signed)
Problem: Ineffective individual coping Goal: LTG: Patient will report a decrease in negative feelings Outcome: Progressing Patient in the milieu, expressing his feelings, interacting appropriately with staff and peers

## 2015-08-26 NOTE — Progress Notes (Addendum)
Patient discharged at this time. No SI/HI/AVH at this time. Verbalizes understanding rt recommended discharge plan of care. Male friend/relative arrives to transport. Acknowledges belongings are all being returned. Safety maintained.

## 2015-08-26 NOTE — Progress Notes (Signed)
Patient with appropriate affect and cooperative behavior with meals, meds and plan of care. No SI/HI/AVH at this time. Good adls and good appetite. Social with peers, verbalizes needs appropriately with staff. Lets MD know that he wants discharge and patient to discharge when discharge plan and transportation in place. Safety maintained.

## 2015-08-26 NOTE — Progress Notes (Signed)
Patient stayed in the dayroom with staff and peers and was appropriate with no concern. Went to his room at bed time and has been sleeping soundly. No sign of discomfort noted. Safety precautions maintained per unit protocol.

## 2015-08-26 NOTE — [facility] Suicide Risk Assessment (Signed)
[facility] INPATIENT:  Family/Significant Other Suicide Prevention Education  Suicide Prevention Education:  Patient Refusal for Family/Significant Other Suicide Prevention Education: The patient Nicholas Delgado has refused to provide written consent for family/significant other to be provided Family/Significant Other Suicide Prevention Education during admission and/or prior to discharge.  Physician notified.  Glennon Mac, MSW, LCSW 08/26/2015, 2:04 PM

## 2015-08-26 NOTE — Plan of Care (Signed)
Problem: Ineffective individual coping Goal: LTG: Patient will report a decrease in negative feelings Outcome: Progressing Patient reports that he is feeling improvement in mood. No sign of distress noted

## 2015-08-26 NOTE — Discharge Summary (Signed)
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 Tobacco use disorder [Z72.0] 08/25/2015  . Opioid use with withdrawal [F11.93] 08/25/2015  . 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 HTN (hypertension) [I10] 08/25/2015  . Dyslipidemia [E78.5] 08/25/2015  . Opioid use disorder, severe, dependence [F11.20] 08/24/2015    Musculoskeletal: Strength & Muscle Tone: within normal limits Gait & Station: normal Patient leans: N/A  Psychiatric Specialty Exam: Physical Exam  Review of Systems  Constitutional: Negative.   HENT: Negative.   Eyes: Negative.   Respiratory: Negative.   Cardiovascular: Negative.   Gastrointestinal: Negative.   Genitourinary: Negative.   Musculoskeletal: Negative.   Skin: Negative.   Neurological: Negative.   Endo/Heme/Allergies: Negative.   Psychiatric/Behavioral: Negative.     Blood pressure 111/78, pulse 81, temperature 98.7 F (37.1 C), temperature source Oral, resp. rate 20, height 6' (1.829 m), weight 77.111 kg (170 lb).Body mass index is 23.05 kg/(m^2).  General Appearance: Well Groomed  Engineer, water::  Good  Speech:  Normal Rate  Volume:  Normal  Mood:  Euthymic  Affect:  Congruent  Thought Process:  Linear and Logical  Orientation:  Full (Time, Place, and Person)  Thought Content:  Hallucinations: None  Suicidal Thoughts:  No  Homicidal Thoughts:  No  Memory:  Immediate;   Good Recent;   Good Remote;   Good  Judgement:  Fair  Insight:  Fair   Psychomotor Activity:  Normal  Concentration:  NA  Recall:  NA  Fund of Knowledge:Good  Language: Good  Akathisia:  No  Handed:    AIMS (if indicated):     Assets:  Communication Skills  ADL's:  Intact  Cognition: WNL  Sleep:  Number of Hours: 7   History of Present Illness:  Nicholas Delgado is a 34 y.o. male patient admitted with a h/o depression who presents to [facility] ED for worsening depression with SI in the setting of opioid withdrawal. He attempted suicide last week by overdosing on pain pills and drinking windshield cleaner. He shared with Dr. Beather Arbour in the ED that he is addicted to opiate pain medications and is tired of being sick with withdrawals all the time.  Pt reports using opiates 195m QID for the past 15 years (no IV use). He endorses the following symptoms of withdrawal including diarrhea, sweats, tachycardia, chills, tremors, nausea, cramps. Pt shared he attempted to overdose on BP pill and consuming windshield washer fluid a few weeks ago. He was trying to kill himself.   His UDS is positive for opiates and benzodiazepines but he denies the use of benzodiazepines or any other drugs other than opioids. He also denies the abuse of alcohol.  Mr. FCannataalso reports a long history of depression that started in his adolescence. Reports that he has been hospitalized here a multitude of times before.  He is currently experiencing opiate withdrawals including nausea, diarrhea, chills and muscle cramps. Pt endorses feeling hopeless, helpless and worthless. He shared his sleep is stable with decreased energy, concentration and appetite. He denies anhedonia and  guilt. He currently denies SI, HI and AVH. There is a questionable h/o mania when the pt was not using drugs.  HPI: See above HPI Elements: Location: see HPI. Quality: worsening. Severity: severe. Timing: the past 3 weeks. Duration: at least 15 years. Context: opiate withdrawal.  Total Time spent  with patient: 1 hour  Past Psychiatric History: Patient reported prior hospitalizations to our unit however he states that he only states that one or 2 days as he usually requests discharge quickly wanted withdrawal [severe. He denies any history of prior suicidal attempts or that his suicidal attempt couple of weeks ago. He denies history of self-injurious behaviors. Reports being diagnosed in the past with depression and anxiety and states that this started around the age of 85. At that time he was seen by psychiatrist who treated him with some of them Prozac. Currently he is not receiving any psychiatric care and is not currently in any psychothropic medication.  Past Medical History: Dyslipidemia and hypertension. He also complains of having past fractures in his left arm, right hand and right foot. Denies any history of seizures denies any history of trauma. Past Medical History  Diagnosis Date  . Hypertension     Past Surgical History  Procedure Laterality Date  . Arm surgery Left    Family History: Patient reports that his mother suffers from depression. Denies substance abuse is his family. Denies suicidal attempts in his family.  Social History: Patient is currently unemployed and has been out of work for 6 months. In the past he has done jobs cutting trees. Currently he is living with his mother in [place]. He separated from his wife. He has 2 biological children ages 99 and 75 and raised two other kids.Marland Kitchen He reports having contact with his children. The patient spends large amounts of money in opiates which he said his getting by "hustling". As far as his education he completed 1 semester in college. As far as his legal history he reports that he is had trouble with the law in the past for larceny charges. Denies having any charges pending at this time. He has been in jail in the past. His longest incarceration has been 3 days. History  Alcohol Use No    History   Drug Use  . Yes    Comment: percocet 6m    Social History   Social History  . Marital Status: Legally Separated    Spouse Name: N/A  . Number of Children: N/A  . Years of Education: N/A   Social History Main Topics  . Smoking status: Current Every Day Smoker -- 2.00 packs/day  . Smokeless tobacco: None  . Alcohol Use: No  . Drug Use: Yes     Comment: percocet 117m . Sexual Activity: Yes   Other Topics Concern  . None         Hospital Course:    [age]ear old with past history of depression and anxiety who presented to our emergency Department reporting worsening depression and suicidality in the setting of opiate withdrawal. Currently not receiving any psychiatric treatment.  Opiate-induced depressive disorder: Patient admitted after voicing suicidality and worsening depression in the setting of withdrawal. It is unclear at this time whether the depressive symptoms are primary or secondary to the opiate use. We'll hold off on restarting antidepressives at this time as they can aggravate withdrawal symptoms.  Opiate withdrawal: I we'll offer symptomatic treatment with Imodium, Flexeril, Vistaril and Phenergan.  Insomnia: Continue trazodone at 150 mg  by mouth daily at bedtime  Hypertension: Continue Coreg and lisinopril  Dyslipidemia: Continue Lipitor 20 mg a day.  Tobacco use disorder: Continue nicotine patch 21 mg.  Pain issues: Patient was prescribed with Neurontin, Voltaren gel, Mobic prior to admission. Will continue the patient on these medications.  Discharge planning: Patient will be discharged today back to his mother's house. The patient will be set up to follow up with [facility] intensive outpatient substance abuse treatment.  On the day of the discharge the patient denied SI, HI or having auditory or visual hallucinations. Mood was euthymic after reactive and bright. The patient appeared hopeful and future  oriented. Appears motivated for treatment.  During his stay there were no episodes of aggression. There was no need for seclusion, restraints or forced medications. This hospitalization was uneventful.   Discharge Vitals:   Blood pressure 111/78, pulse 81, temperature 98.7 F (37.1 C), temperature source Oral, resp. rate 20, height 6' (1.829 m), weight 77.111 kg (170 lb). Body mass index is 23.05 kg/(m^2).  Lab Results:   Results for orders placed or performed during the hospital encounter of 08/24/15 (from the past 72 hour(s))  Comprehensive metabolic panel     Status: Abnormal   Collection Time: 08/24/15 12:46 AM  Result Value Ref Range   Sodium 138 135 - 145 mmol/L   Potassium 3.8 3.5 - 5.1 mmol/L   Chloride 106 101 - 111 mmol/L   CO2 26 22 - 32 mmol/L   Glucose, Bld 127 (H) 65 - 99 mg/dL   BUN 18 6 - 20 mg/dL   Creatinine, Ser 0.63 0.61 - 1.24 mg/dL   Calcium 8.6 (L) 8.9 - 10.3 mg/dL   Total Protein 6.5 6.5 - 8.1 g/dL   Albumin 3.8 3.5 - 5.0 g/dL   AST 28 15 - 41 U/L   ALT 14 (L) 17 - 63 U/L   Alkaline Phosphatase 90 38 - 126 U/L   Total Bilirubin 0.7 0.3 - 1.2 mg/dL   GFR calc non Af Amer >60 >60 mL/min   GFR calc Af Amer >60 >60 mL/min    Comment: (NOTE) The eGFR has been calculated using the CKD EPI equation. This calculation has not been validated in all clinical situations. eGFR's persistently <60 mL/min signify possible Chronic Kidney Disease.    Anion gap 6 5 - 15  Ethanol (ETOH)     Status: None   Collection Time: 08/24/15 12:46 AM  Result Value Ref Range   Alcohol, Ethyl (B) <5 <5 mg/dL    Comment:        LOWEST DETECTABLE LIMIT FOR SERUM ALCOHOL IS 5 mg/dL FOR MEDICAL PURPOSES ONLY   Salicylate level     Status: None   Collection Time: 08/24/15 12:46 AM  Result Value Ref Range   Salicylate Lvl 5.2 2.8 - 30.0 mg/dL  Acetaminophen level     Status: None   Collection Time: 08/24/15 12:46 AM  Result Value Ref Range   Acetaminophen (Tylenol), Serum 14 10 -  30 ug/mL    Comment:        THERAPEUTIC CONCENTRATIONS VARY SIGNIFICANTLY. A RANGE OF 10-30 ug/mL MAY BE AN EFFECTIVE CONCENTRATION FOR MANY PATIENTS. HOWEVER, SOME ARE BEST TREATED AT CONCENTRATIONS OUTSIDE THIS RANGE. ACETAMINOPHEN CONCENTRATIONS >150 ug/mL AT 4 HOURS AFTER INGESTION AND >50 ug/mL AT 12 HOURS AFTER INGESTION ARE OFTEN ASSOCIATED WITH TOXIC REACTIONS.   CBC     Status: Abnormal   Collection Time: 08/24/15 12:46 AM  Result Value Ref Range  WBC 7.7 3.8 - 10.6 K/uL   RBC 4.15 (L) 4.40 - 5.90 MIL/uL   Hemoglobin 13.2 13.0 - 18.0 g/dL   HCT 37.2 (L) 40.0 - 52.0 %   MCV 89.6 80.0 - 100.0 fL   MCH 31.8 26.0 - 34.0 pg   MCHC 35.5 32.0 - 36.0 g/dL   RDW 13.5 11.5 - 14.5 %   Platelets 171 150 - 440 K/uL  Urine Drug Screen, Qualitative ([facility] only)     Status: Abnormal   Collection Time: 08/24/15 12:46 AM  Result Value Ref Range   Tricyclic, Ur Screen NONE DETECTED NONE DETECTED   Amphetamines, Ur Screen NONE DETECTED NONE DETECTED   MDMA (Ecstasy)Ur Screen NONE DETECTED NONE DETECTED   Cocaine Metabolite,Ur [place] NONE DETECTED NONE DETECTED   Opiate, Ur Screen POSITIVE (A) NONE DETECTED   Phencyclidine (PCP) Ur S NONE DETECTED NONE DETECTED   Cannabinoid 50 Ng, Ur [place] NONE DETECTED NONE DETECTED   Barbiturates, Ur Screen NONE DETECTED NONE DETECTED   Benzodiazepine, Ur Scrn POSITIVE (A) NONE DETECTED   Methadone Scn, Ur NONE DETECTED NONE DETECTED    Comment: (NOTE) 993  Tricyclics, urine               Cutoff 1000 ng/mL 200  Amphetamines, urine             Cutoff 1000 ng/mL 300  MDMA (Ecstasy), urine           Cutoff 500 ng/mL 400  Cocaine Metabolite, urine       Cutoff 300 ng/mL 500  Opiate, urine                   Cutoff 300 ng/mL 600  Phencyclidine (PCP), urine      Cutoff 25 ng/mL 700  Cannabinoid, urine              Cutoff 50 ng/mL 800  Barbiturates, urine             Cutoff 200 ng/mL 900  Benzodiazepine, urine           Cutoff 200 ng/mL 1000 Methadone,  urine                Cutoff 300 ng/mL 1100 1200 The urine drug screen provides only a preliminary, unconfirmed 1300 analytical test result and should not be used for non-medical 1400 purposes. Clinical consideration and professional judgment should 1500 be applied to any positive drug screen result due to possible 1600 interfering substances. A more specific alternate chemical method 1700 must be used in order to obtain a confirmed analytical result.  1800 Gas chromato graphy / mass spectrometry (GC/MS) is the preferred 1900 confirmatory method.   Acetaminophen level     Status: None   Collection Time: 08/24/15 12:46 AM  Result Value Ref Range   Acetaminophen (Tylenol), Serum 14 10 - 30 ug/mL    Comment:        THERAPEUTIC CONCENTRATIONS VARY SIGNIFICANTLY. A RANGE OF 10-30 ug/mL MAY BE AN EFFECTIVE CONCENTRATION FOR MANY PATIENTS. HOWEVER, SOME ARE BEST TREATED AT CONCENTRATIONS OUTSIDE THIS RANGE. ACETAMINOPHEN CONCENTRATIONS >150 ug/mL AT 4 HOURS AFTER INGESTION AND >50 ug/mL AT 12 HOURS AFTER INGESTION ARE OFTEN ASSOCIATED WITH TOXIC REACTIONS.   Salicylate level     Status: None   Collection Time: 08/24/15 12:46 AM  Result Value Ref Range   Salicylate Lvl 4.9 2.8 - 30.0 mg/dL  Acetaminophen level     Status: Abnormal   Collection Time: 08/24/15  5:01 AM  Result Value Ref Range   Acetaminophen (Tylenol), Serum <10 (L) 10 - 30 ug/mL    Comment:        THERAPEUTIC CONCENTRATIONS VARY SIGNIFICANTLY. A RANGE OF 10-30 ug/mL MAY BE AN EFFECTIVE CONCENTRATION FOR MANY PATIENTS. HOWEVER, SOME ARE BEST TREATED AT CONCENTRATIONS OUTSIDE THIS RANGE. ACETAMINOPHEN CONCENTRATIONS >150 ug/mL AT 4 HOURS AFTER INGESTION AND >50 ug/mL AT 12 HOURS AFTER INGESTION ARE OFTEN ASSOCIATED WITH TOXIC REACTIONS.   Salicylate level     Status: None   Collection Time: 08/24/15  5:01 AM  Result Value Ref Range   Salicylate Lvl 6.6 2.8 - 30.0 mg/dL    Physical Findings: AIMS:  Facial and Oral Movements Muscles of Facial Expression: None, normal Lips and Perioral Area: None, normal Jaw: None, normal Tongue: None, normal,Extremity Movements Upper (arms, wrists, hands, fingers): None, normal Lower (legs, knees, ankles, toes): None, normal, Trunk Movements Neck, shoulders, hips: None, normal, Overall Severity Severity of abnormal movements (highest score from questions above): None, normal Incapacitation due to abnormal movements: None, normal Patient's awareness of abnormal movements (rate only patient's report): No Awareness, Dental Status Current problems with teeth and/or dentures?: No Does patient usually wear dentures?: No  CIWA:  CIWA-Ar Total: 2 COWS:  COWS Total Score: 9     Discharge Instructions    Diet - low sodium heart healthy    Complete by:  As directed      Increase activity slowly    Complete by:  As directed             Medication List    STOP taking these medications        naproxen 500 MG tablet  Commonly known as:  NAPROSYN     oxyCODONE-acetaminophen 5-325 MG per tablet  Commonly known as:  PERCOCET/ROXICET     traMADol 50 MG tablet  Commonly known as:  ULTRAM      TAKE these medications      Indication   atorvastatin 20 MG tablet  Commonly known as:  LIPITOR  Take 1 tablet by mouth daily.      carvedilol 6.25 MG tablet  Commonly known as:  COREG  Take 6.25 mg by mouth 2 (two) times daily with a meal.      diclofenac sodium 1 % Gel  Commonly known as:  VOLTAREN  Apply 2 g topically 2 (two) times daily.      gabapentin 300 MG capsule  Commonly known as:  NEURONTIN  Take 900 mg by mouth 4 (four) times daily.      lisinopril 20 MG tablet  Commonly known as:  PRINIVIL,ZESTRIL  Take 20 mg by mouth daily.      meloxicam 15 MG tablet  Commonly known as:  MOBIC  Take 1 tablet (15 mg total) by mouth daily.      traZODone 150 MG tablet  Commonly known as:  DESYREL  Take 1 tablet (150 mg total) by mouth at  bedtime as needed for sleep.          Total Discharge Time: 30 minutes  Signed: Hildred Priest 08/26/2015, 10:57 AM

## 2015-12-26 ENCOUNTER — Emergency Department
Admission: EM | Admit: 2015-12-26 | Discharge: 2015-12-26 | Disposition: A | Discharge status: Home / Self Care | Payer: BLUE CROSS/BLUE SHIELD | Attending: Emergency Medicine | Admitting: Emergency Medicine

## 2015-12-26 ENCOUNTER — Encounter: Payer: Self-pay | Admitting: Emergency Medicine

## 2015-12-26 ENCOUNTER — Emergency Department: Payer: BLUE CROSS/BLUE SHIELD

## 2015-12-26 DIAGNOSIS — R2231 Localized swelling, mass and lump, right upper limb: Secondary | ICD-10-CM | POA: Diagnosis not present

## 2015-12-26 DIAGNOSIS — M79641 Pain in right hand: Secondary | ICD-10-CM | POA: Insufficient documentation

## 2015-12-26 DIAGNOSIS — I10 Essential (primary) hypertension: Secondary | ICD-10-CM | POA: Diagnosis not present

## 2015-12-26 DIAGNOSIS — M25532 Pain in left wrist: Secondary | ICD-10-CM | POA: Diagnosis not present

## 2015-12-26 DIAGNOSIS — Z79899 Other long term (current) drug therapy: Secondary | ICD-10-CM | POA: Insufficient documentation

## 2015-12-26 DIAGNOSIS — F17200 Nicotine dependence, unspecified, uncomplicated: Secondary | ICD-10-CM | POA: Insufficient documentation

## 2015-12-26 DIAGNOSIS — Z791 Long term (current) use of non-steroidal anti-inflammatories (NSAID): Secondary | ICD-10-CM | POA: Diagnosis not present

## 2015-12-26 DIAGNOSIS — M7989 Other specified soft tissue disorders: Secondary | ICD-10-CM

## 2015-12-26 LAB — BASIC METABOLIC PANEL
Anion gap: 8 (ref 5–15)
BUN: 16 mg/dL (ref 6–20)
CALCIUM: 9.1 mg/dL (ref 8.9–10.3)
CO2: 26 mmol/L (ref 22–32)
Chloride: 104 mmol/L (ref 101–111)
Creatinine, Ser: 0.69 mg/dL (ref 0.61–1.24)
Glucose, Bld: 96 mg/dL (ref 65–99)
Potassium: 3.7 mmol/L (ref 3.5–5.1)
SODIUM: 138 mmol/L (ref 135–145)

## 2015-12-26 LAB — CBC WITH DIFFERENTIAL/PLATELET
BASOS ABS: 0 10*3/uL (ref 0–0.1)
BASOS PCT: 0 %
Eosinophils Absolute: 0.1 10*3/uL (ref 0–0.7)
Eosinophils Relative: 1 %
HEMATOCRIT: 37 % — AB (ref 40.0–52.0)
Hemoglobin: 12.7 g/dL — ABNORMAL LOW (ref 13.0–18.0)
LYMPHS PCT: 22 %
Lymphs Abs: 2.5 10*3/uL (ref 1.0–3.6)
MCH: 30.6 pg (ref 26.0–34.0)
MCHC: 34.2 g/dL (ref 32.0–36.0)
MCV: 89.5 fL (ref 80.0–100.0)
Monocytes Absolute: 0.9 10*3/uL (ref 0.2–1.0)
Monocytes Relative: 8 %
NEUTROS ABS: 7.6 10*3/uL — AB (ref 1.4–6.5)
Neutrophils Relative %: 69 %
PLATELETS: 196 10*3/uL (ref 150–440)
RBC: 4.14 MIL/uL — AB (ref 4.40–5.90)
RDW: 13.7 % (ref 11.5–14.5)
WBC: 11.1 10*3/uL — AB (ref 3.8–10.6)

## 2015-12-26 LAB — URIC ACID: Uric Acid, Serum: 3.3 mg/dL — ABNORMAL LOW (ref 4.4–7.6)

## 2015-12-26 MED ORDER — TRAMADOL HCL 50 MG PO TABS
50.0000 mg | ORAL_TABLET | Freq: Once | ORAL | Status: AC
  Administered 2015-12-26: 50 mg via ORAL

## 2015-12-26 MED ORDER — TRAMADOL HCL 50 MG PO TABS
ORAL_TABLET | ORAL | Status: AC
  Filled 2015-12-26: qty 1

## 2015-12-26 MED ORDER — INDOMETHACIN 50 MG PO CAPS: 50.0000 mg | ORAL_CAPSULE | Freq: Three times a day (TID) | ORAL | Status: DC

## 2015-12-26 NOTE — ED Provider Notes (Signed)
[facility] Emergency Department Provider Note ____________________________________________  Time seen: 1033  I have reviewed the triage vital signs and the nursing notes.  HISTORY  Chief Complaint  Joint Swelling  HPI Nicholas Delgado is a 35 y.o. male presents to the ED for complaints of pain and swelling to the radial aspect of his right hand and wrist for the last 2 weeks. He is right-hand dominant male but denies any interim injury, or trauma. He reports in the last 2 weeks as well he's had some intermittent swelling to the left wrist, which has since resolved. He describes tightness to the lateral wrist as well as some redness and pain with range of motion. He is concerned that he might have gout in the wrist. He rates the pain at a 10/10 in triage.He has not seen his primary care provider Dr. Graciela Husbands, in the interim for this complaint.  Past Medical History  Diagnosis Date  . Hypertension     Patient Active Problem List   Diagnosis Date Noted  . Tobacco use disorder 08/25/2015  . Opioid use with withdrawal (HCC) 08/25/2015  . 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 HTN (hypertension) 08/25/2015  . Dyslipidemia 08/25/2015  . Opioid use disorder, severe, dependence (HCC) 08/24/2015    Past Surgical History  Procedure Laterality Date  . Arm surgery Left     Current Outpatient Rx  Name  Route  Sig  Dispense  Refill  . atorvastatin (LIPITOR) 20 MG tablet   Oral   Take 1 tablet by mouth daily.         . carvedilol (COREG) 6.25 MG tablet   Oral   Take 6.25 mg by mouth 2 (two) times daily with a meal.         . diclofenac sodium (VOLTAREN) 1 % GEL   Topical   Apply 2 g topically 2 (two) times daily.         Marland Kitchen gabapentin (NEURONTIN) 300 MG capsule   Oral   Take 900 mg by mouth 4 (four) times daily.         . indomethacin (INDOCIN) 50 MG capsule   Oral   Take 1 capsule (50 mg total) by mouth 3  (three) times daily with meals.   21 capsule   0   . lisinopril (PRINIVIL,ZESTRIL) 20 MG tablet   Oral   Take 20 mg by mouth daily.         . meloxicam (MOBIC) 15 MG tablet   Oral   Take 1 tablet (15 mg total) by mouth daily.   30 tablet   0   . traZODone (DESYREL) 150 MG tablet   Oral   Take 1 tablet (150 mg total) by mouth at bedtime as needed for sleep.   30 tablet   0    Allergies Sulfa antibiotics  History reviewed. No pertinent family history.  Social History Social History  Substance Use Topics  . Smoking status: Current Every Day Smoker -- 2.00 packs/day  . Smokeless tobacco: None  . Alcohol Use: No    Review of Systems  Constitutional: Negative for fever. Eyes: Negative for visual changes. ENT: Negative for sore throat. Cardiovascular: Negative for chest pain. Respiratory: Negative for shortness of breath. Gastrointestinal: Negative for abdominal pain, vomiting and diarrhea. Genitourinary: Negative for dysuria. Musculoskeletal: Negative for back pain. Right hand pain as above. Skin: Negative for rash. Neurological: Negative for headaches, focal weakness or numbness. ____________________________________________  PHYSICAL EXAM:  VITAL SIGNS: ED Triage Vitals  Enc Vitals Group     BP 12/26/15 0901 137/101 mmHg     Pulse Rate 12/26/15 0901 95     Resp 12/26/15 0901 20     Temp 12/26/15 0901 98.3 F (36.8 C)     Temp Source 12/26/15 0901 Oral     SpO2 12/26/15 0901 100 %     Weight 12/26/15 0901 180 lb (81.647 kg)     Height 12/26/15 0901 5\' 11"  (1.803 m)     Head Cir --      Peak Flow --      Pain Score 12/26/15 0904 10     Pain Loc --      Pain Edu? --      Excl. in GC? --    Constitutional: Alert and oriented. Well appearing and in no distress. Head: Normocephalic and atraumatic.      Eyes: Conjunctivae are normal. PERRL. Normal extraocular movements      Ears: Canals clear. TMs intact bilaterally.   Nose: No  congestion/rhinorrhea.   Mouth/Throat: Mucous membranes are moist.   Neck: Supple. No thyromegaly. Hematological/Lymphatic/Immunological: No cervical lymphadenopathy. Cardiovascular: Normal rate, regular rhythm. Normal distal pulses and normal capillary refill. Respiratory: Normal respiratory effort. No wheezes/rales/rhonchi. Gastrointestinal: Soft and nontender. No distention. Musculoskeletal: Right hand with obvious swelling to the radial aspect at the [facility]. Patient with normal composite fist but decreased strength secondary to pain. No specific snuffbox tenderness is appreciated. Nontender with normal range of motion in all extremities.  Neurologic: Normal gross sensation and normal intrinsics noted. Cranial nerves II through XII grossly intact. Normal UE DTRs bilaterally. Normal gait without ataxia. Normal speech and language. No gross focal neurologic deficits are appreciated. Skin:  Skin is warm, dry and intact. No rash noted. Psychiatric: Mood and affect are normal. Patient exhibits appropriate insight and judgment. ____________________________________________   LABS (pertinent positives/negatives) Labs Reviewed  CBC WITH DIFFERENTIAL/PLATELET - Abnormal; Notable for the following:    WBC 11.1 (*)    RBC 4.14 (*)    Hemoglobin 12.7 (*)    HCT 37.0 (*)    Neutro Abs 7.6 (*)    All other components within normal limits  URIC ACID - Abnormal; Notable for the following:    Uric Acid, Serum 3.3 (*)    All other components within normal limits  BASIC METABOLIC PANEL  ____________________________________________   RADIOLOGY Right Hand IMPRESSION: No cause of acute pain identified. Old healed boxer's fracture of the fifth metacarpal. Mild radial side carpal osteoarthritis.  I, Deantae Shackleton, Charlesetta Ivory, personally viewed and evaluated these images (plain radiographs) as part of my medical decision making, as well as reviewing the written report by the  radiologist. ____________________________________________  PROCEDURES  Ultram 50 mg PO ____________________________________________  INITIAL IMPRESSION / ASSESSMENT AND PLAN / ED COURSE  Patient with swelling to the right radial wrist over the last 2 weeks without injury or trauma. The lab results today do not confirm an elevated uric acid level. His labs otherwise normal and the x-ray does not show any fracture dislocation. The patient is notified of his lab and x-ray results. He is discharged for treatment of an acute arthralgia of an unknown etiology. He will be discharged with indomethacin to dose in lieu of his daily meloxicam for the next week. He will follow with his primary care provider continue his daily narcotic medications as needed. ____________________________________________  FINAL CLINICAL IMPRESSION(S) / ED DIAGNOSES  Final diagnoses:  Hand pain, right  Swelling of right hand      Lissa HoardJenise V Bacon Savva Beamer, PA-C 12/26/15 1230  Darien Ramusavid W Kaminski, MD 12/26/15 1500

## 2015-12-26 NOTE — ED Notes (Signed)
Reports pain in right wrists, elbow and shoulder.  States sometimes its both sides.  Skin w/d, +PMS to bilat hands.

## 2015-12-26 NOTE — ED Notes (Signed)
Pt states wrist and hand swelling on and off for 2 weeks, states today he felt right arm pain and swelling up to his shoulder, states hands feel "tight", swelling noted upon assessment, hands pale in color

## 2015-12-26 NOTE — Discharge Instructions (Signed)
Your labs and x-ray do not reveal any elevated uric acid or bony injury. There is only mild arthritis on the thumb side of the hand. You should take your home medicines and the prescription anti-inflammatory as directed. Dose the diclofenac in the place of your daily meloxicam for the next week. Continue your daily oxycodone as previously prescribed. Apply cool compresses and follow-up with Dr. Martha ClanKrasinski as needed for ongoing symptoms.

## 2016-08-23 ENCOUNTER — Emergency Department
Admission: EM | Admit: 2016-08-23 | Discharge: 2016-08-23 | Disposition: A | Discharge status: Home / Self Care | Payer: Medicaid Other | Attending: Emergency Medicine | Admitting: Emergency Medicine

## 2016-08-23 DIAGNOSIS — M2550 Pain in unspecified joint: Secondary | ICD-10-CM | POA: Insufficient documentation

## 2016-08-23 DIAGNOSIS — I10 Essential (primary) hypertension: Secondary | ICD-10-CM | POA: Insufficient documentation

## 2016-08-23 DIAGNOSIS — F17200 Nicotine dependence, unspecified, uncomplicated: Secondary | ICD-10-CM | POA: Diagnosis not present

## 2016-08-23 DIAGNOSIS — K047 Periapical abscess without sinus: Secondary | ICD-10-CM | POA: Insufficient documentation

## 2016-08-23 DIAGNOSIS — Z79899 Other long term (current) drug therapy: Secondary | ICD-10-CM | POA: Diagnosis not present

## 2016-08-23 DIAGNOSIS — K0889 Other specified disorders of teeth and supporting structures: Secondary | ICD-10-CM | POA: Diagnosis present

## 2016-08-23 MED ORDER — MELOXICAM 7.5 MG PO TABS
15.0000 mg | ORAL_TABLET | Freq: Once | ORAL | Status: AC
  Administered 2016-08-23: 15 mg via ORAL
  Filled 2016-08-23: qty 2

## 2016-08-23 MED ORDER — ACETAMINOPHEN-CODEINE #3 300-30 MG PO TABS: 1.0000 | ORAL_TABLET | ORAL | 0 refills | Status: DC | PRN

## 2016-08-23 MED ORDER — MELOXICAM 15 MG PO TABS: 15.0000 mg | ORAL_TABLET | Freq: Every day | ORAL | 0 refills | Status: DC

## 2016-08-23 MED ORDER — OXYCODONE-ACETAMINOPHEN 5-325 MG PO TABS
2.0000 | ORAL_TABLET | Freq: Once | ORAL | Status: AC
  Administered 2016-08-23: 2 via ORAL
  Filled 2016-08-23: qty 2

## 2016-08-23 MED ORDER — CLINDAMYCIN HCL 300 MG PO CAPS: 300.0000 mg | ORAL_CAPSULE | Freq: Three times a day (TID) | ORAL | 0 refills | Status: AC

## 2016-08-23 MED ORDER — CLINDAMYCIN HCL 150 MG PO CAPS
300.0000 mg | ORAL_CAPSULE | Freq: Once | ORAL | Status: AC
  Administered 2016-08-23: 300 mg via ORAL
  Filled 2016-08-23: qty 2

## 2016-08-23 NOTE — Discharge Instructions (Signed)
Please call and schedule a dental appointment as soon as possible. You will need to be seen within the next 14 days. Return to the emergency department for symptoms that change or worsen if you're unable to schedule an appointment.  OPTIONS FOR DENTAL FOLLOW UP CARE  [place] Department of Health and Human Services - Local Safety Net Dental Clinics http://www.ncdhhs.gov/dph/oralhealth/services/safetynetclinics.htm   [facility] ([phone])  [facility] ([phone])  [facility] ([phone])  [facility] ([phone])  [facility] ([phone]) This clinic caters to the indigent population and is on a lottery system. Location: [facility], [address], [place] Clinic Hours: Wednesdays from 6pm - 9pm, patients seen by a lottery system. For dates, call or go to www.med.[facility].edu/[facility]/patients/Dental-[facility] Services: Cleanings, fillings and simple extractions. Payment Options: DENTAL WORK IS FREE OF CHARGE. Bring proof of income or support. Best way to get seen: Arrive at 5:15 pm - this is a lottery, NOT first come/first serve, so arriving earlier will not increase your chances of being seen.     [facility] [phone] Select option 1 for emergencies   Location: [facility], [address], [place] Clinic Hours: No walk-ins accepted - call the day before to schedule an appointment. Check in times are 9:30 am and 1:30 pm. Services: Simple extractions, temporary fillings, pulpectomy/pulp debridement, uncomplicated abscess drainage. Payment Options: PAYMENT IS DUE AT THE TIME OF SERVICE.  Fee is usually $100-200, additional surgical procedures (e.g. abscess drainage) may be extra. Cash, checks, Visa/MasterCard accepted.  Can file Medicaid if patient is covered for dental - patient should call case worker to check. No  discount for [facility] patients. Best way to get seen: MUST call the day before and get onto the schedule. Can usually be seen the next 1-2 days. No walk-ins accepted.     [facility] [phone]   Location: [facility], [address], [place] Clinic Hours: M, W, Th, F 8am or 1:30pm, Tues 9a or 1:30 - first come/first served. Services: Simple extractions, temporary fillings, uncomplicated abscess drainage.  You do not need to be an [place] resident. Payment Options: PAYMENT IS DUE AT THE TIME OF SERVICE. Dental insurance, otherwise sliding scale - bring proof of income or support. Depending on income and treatment needed, cost is usually $50-200. Best way to get seen: Arrive early as it is first come/first served.     [facility] [phone]   Location: [address] Clinic Hours: Mon-Thu 8a-5p Services: Most basic dental services including extractions and fillings. Payment Options: PAYMENT IS DUE AT THE TIME OF SERVICE. Sliding scale, up to 50% off - bring proof if income or support. Medicaid with dental option accepted. Best way to get seen: Call to schedule an appointment, can usually be seen within 2 weeks OR they will try to see walk-ins - show up at 8a or 2p (you may have to wait).     [facility] [phone] [place] RESIDENTS ONLY   Location: [facility], [address], [place], [place] [address] Clinic Hours: By appointment only. Monday - Thursday 8am-5pm, Friday 8am-12pm Services: Cleanings, fillings, extractions. Payment Options: PAYMENT IS DUE AT THE TIME OF SERVICE. Cash, Visa or MasterCard. Sliding scale - $30 minimum per service. Best way to get seen: Come in to office, complete packet and make an appointment -   need proof of income or support monies for each household member and proof of [place]  residence. Usually takes about a month to get in.     [facility] [phone]   Location: [address] [place] Clinic Hours: Walk-in Urgent Care Dental Services are offered Monday-Friday mornings only. The numbers of emergencies accepted daily is limited to the number of providers available. Maximum 15 - Mondays, Wednesdays & Thursdays Maximum 10 - Tuesdays & Fridays Services: You do not need to be a [place] resident to be seen for a dental emergency. Emergencies are defined as pain, swelling, abnormal bleeding, or dental trauma. Walkins will receive x-rays if needed. NOTE: Dental cleaning is not an emergency. Payment Options: PAYMENT IS DUE AT THE TIME OF SERVICE. Minimum co-pay is $40.00 for uninsured patients. Minimum co-pay is $3.00 for Medicaid with dental coverage. Dental Insurance is accepted and must be presented at time of visit. Medicare does not cover dental. Forms of payment: Cash, credit card, checks. Best way to get seen: If not previously registered with the clinic, walk-in dental registration begins at 7:15 am and is on a first come/first serve basis. If previously registered with the clinic, call to make an appointment.     [facility] [phone] [place] RESIDENTS ONLY   Location: [address], [place], [place] Clinic Hours: Mon-Thu 10a-2p Services: Extractions only! Payment Options: FREE (donations accepted) - bring proof of income or support Best way to get seen: Call and schedule an appointment OR come at 8am on the 1st Monday of every month (except for holidays) when it is first come/first served.     [facility] [phone]   Location: [address], [place] Clinic Hours: Friday mornings Services, Payment Options, Best way to get seen: Call for info  

## 2016-08-23 NOTE — ED Provider Notes (Signed)
[facility] Emergency Department Provider Note ____________________________________________  Time seen: Approximately 10:58 PM  I have reviewed the triage vital signs and the nursing notes.   HISTORY  Chief Complaint Dental Pain   HPI Nicholas Delgado is a 35 y.o. male resents to the emergency department for evaluation of dental pain. He has had a dental abscess on an off since July. He was recently in jail, and was receiving antibiotics which helped intermittently. He states that the past 2 days have been increasingly worse and he can't deal with the pain anymore. He has taken Tylenol without relief. He also states that he means an evaluation for rheumatoid arthritis due to pain and swelling in multiple joints. He states that this is a chronic issue that seems to be worsening.  Past Medical History:  Diagnosis Date  . Hypertension     Patient Active Problem List   Diagnosis Date Noted  . Tobacco use disorder 08/25/2015  . Opioid use with withdrawal (HCC) 08/25/2015  . 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 HTN (hypertension) 08/25/2015  . Dyslipidemia 08/25/2015  . Opioid use disorder, severe, dependence (HCC) 08/24/2015    Past Surgical History:  Procedure Laterality Date  . arm surgery Left     Prior to Admission medications   Medication Sig Start Date End Date Taking? Authorizing Provider  acetaminophen-codeine (TYLENOL #3) 300-30 MG tablet Take 1-2 tablets by mouth every 4 (four) hours as needed for moderate pain. 08/23/16   Chinita Pester, FNP  atorvastatin (LIPITOR) 20 MG tablet Take 1 tablet by mouth daily. 01/31/15 01/31/16  Historical Provider, MD  carvedilol (COREG) 6.25 MG tablet Take 6.25 mg by mouth 2 (two) times daily with a meal.    Historical Provider, MD  clindamycin (CLEOCIN) 300 MG capsule Take 1 capsule (300 mg total) by mouth 3 (three) times daily. 08/23/16 09/02/16  Chinita Pester, FNP    gabapentin (NEURONTIN) 300 MG capsule Take 900 mg by mouth 4 (four) times daily.    Historical Provider, MD  indomethacin (INDOCIN) 50 MG capsule Take 1 capsule (50 mg total) by mouth 3 (three) times daily with meals. 12/26/15   Jenise V Bacon Menshew, PA-C  lisinopril (PRINIVIL,ZESTRIL) 20 MG tablet Take 20 mg by mouth daily.    Historical Provider, MD  meloxicam (MOBIC) 15 MG tablet Take 1 tablet (15 mg total) by mouth daily. 08/23/16   Chinita Pester, FNP  traZODone (DESYREL) 150 MG tablet Take 1 tablet (150 mg total) by mouth at bedtime as needed for sleep. 08/26/15   Jimmy Footman, MD    Allergies Sulfa antibiotics  No family history on file.  Social History Social History  Substance Use Topics  . Smoking status: Current Every Day Smoker    Packs/day: 2.00  . Smokeless tobacco: Not on file  . Alcohol use No    Review of Systems Constitutional: Uncomfortable appearing. ENT: Positive for dental pain. Musculoskeletal: Negative for jaw pain/trismus. Positive for polyarthralgia Skin: Positive for facial swelling ____________________________________________   PHYSICAL EXAM:  VITAL SIGNS: ED Triage Vitals  Enc Vitals Group     BP 08/23/16 2247 (!) 150/89     Pulse Rate 08/23/16 2247 (!) 106     Resp 08/23/16 2247 20     Temp 08/23/16 2247 98.2 F (36.8 C)     Temp Source 08/23/16 2247 Oral     SpO2 08/23/16 2247 97 %     Weight 08/23/16 2243 185  lb (83.9 kg)     Height 08/23/16 2243 5\' 11"  (1.803 m)     Head Circumference --      Peak Flow --      Pain Score 08/23/16 2244 10     Pain Loc --      Pain Edu? --      Excl. in GC? --     Constitutional: Alert and oriented. Well appearing and Appears to be in pain Eyes: Conjunctivae are normal.EOMI. Mouth/Throat: Mucous membranes are moist. Oropharynx non-erythematous. Periodontal Exam    Hematological/Lymphatic/Immunilogical: No cervical lymphadenopathy. Respiratory: Normal respiratory effort.   Musculoskeletal: Full range of motion of extremities. Mild edema without erythema of the right wrist. Neurologic:  Normal speech and language. No gross focal neurologic deficits are appreciated. Speech is normal. No gait instability. Skin:  No erythema over the skin surfaces. No lesion, wound, rash. Psychiatric: Mood and affect are normal. Speech and behavior are normal.  ____________________________________________   LABS (all labs ordered are listed, but only abnormal results are displayed)  Labs Reviewed - No data to display ____________________________________________   RADIOLOGY  Not indicated ____________________________________________   PROCEDURES  Procedure(s) performed: None  Critical Care performed: No  ____________________________________________   INITIAL IMPRESSION / ASSESSMENT AND PLAN / ED COURSE  Pertinent labs & imaging results that were available during my care of the patient were reviewed by me and considered in my medical decision making (see chart for details). Patient will be prescribed Clindamycin and meloxicam. Patient was advised to see the dentist within 14 days. Also advised to take the antibiotic until finished. He was also advised to follow up with primary care of his choice for further evaluation of joint pain.  Instructed to return to the ER for symptoms that change or worsen if unable to schedule an appointment. ____________________________________________   FINAL CLINICAL IMPRESSION(S) / ED DIAGNOSES  Final diagnoses:  Dental abscess  Polyarthralgia    Note:  This document was prepared using Dragon voice recognition software and may include unintentional dictation errors.    Chinita PesterCari B Emmelyn Schmale, FNP 08/26/16 2208    Myrna Blazeravid Matthew Schaevitz, MD 08/27/16 [id number]

## 2016-08-23 NOTE — ED Triage Notes (Signed)
Patient ambulatory to triage with steady gait, without difficulty or distress noted; pt reports left lower dental pain since July

## 2017-05-05 ENCOUNTER — Emergency Department
Admission: EM | Admit: 2017-05-05 | Discharge: 2017-05-05 | Disposition: A | Discharge status: Home / Self Care | Payer: Medicaid Other | Attending: Emergency Medicine | Admitting: Emergency Medicine

## 2017-05-05 ENCOUNTER — Emergency Department: Payer: Medicaid Other

## 2017-05-05 ENCOUNTER — Encounter: Payer: Self-pay | Admitting: Emergency Medicine

## 2017-05-05 DIAGNOSIS — L03114 Cellulitis of left upper limb: Secondary | ICD-10-CM | POA: Diagnosis not present

## 2017-05-05 DIAGNOSIS — B9562 Methicillin resistant Staphylococcus aureus infection as the cause of diseases classified elsewhere: Secondary | ICD-10-CM | POA: Insufficient documentation

## 2017-05-05 DIAGNOSIS — I10 Essential (primary) hypertension: Secondary | ICD-10-CM | POA: Insufficient documentation

## 2017-05-05 DIAGNOSIS — R2232 Localized swelling, mass and lump, left upper limb: Secondary | ICD-10-CM | POA: Diagnosis present

## 2017-05-05 DIAGNOSIS — F17200 Nicotine dependence, unspecified, uncomplicated: Secondary | ICD-10-CM | POA: Diagnosis not present

## 2017-05-05 LAB — COMPREHENSIVE METABOLIC PANEL
ALBUMIN: 3.8 g/dL (ref 3.5–5.0)
ALK PHOS: 97 U/L (ref 38–126)
ALT: 15 U/L — ABNORMAL LOW (ref 17–63)
ANION GAP: 4 — AB (ref 5–15)
AST: 16 U/L (ref 15–41)
BUN: 13 mg/dL (ref 6–20)
CALCIUM: 8.7 mg/dL — AB (ref 8.9–10.3)
CO2: 30 mmol/L (ref 22–32)
Chloride: 101 mmol/L (ref 101–111)
Creatinine, Ser: 0.76 mg/dL (ref 0.61–1.24)
GFR calc Af Amer: >60 mL/min (ref >60)
GFR calc non Af Amer: >60 mL/min (ref >60)
GLUCOSE: 141 mg/dL — AB (ref 65–99)
POTASSIUM: 4.2 mmol/L (ref 3.5–5.1)
Sodium: 135 mmol/L (ref 135–145)
Total Bilirubin: 0.4 mg/dL (ref 0.3–1.2)
Total Protein: 7.2 g/dL (ref 6.5–8.1)

## 2017-05-05 LAB — CBC
HCT: 38 % — ABNORMAL LOW (ref 40.0–52.0)
HEMOGLOBIN: 13.4 g/dL (ref 13.0–18.0)
MCH: 31.7 pg (ref 26.0–34.0)
MCHC: 35.3 g/dL (ref 32.0–36.0)
MCV: 89.9 fL (ref 80.0–100.0)
Platelets: 238 10*3/uL (ref 150–440)
RBC: 4.23 MIL/uL — ABNORMAL LOW (ref 4.40–5.90)
RDW: 13.7 % (ref 11.5–14.5)
WBC: 6.1 10*3/uL (ref 3.8–10.6)

## 2017-05-05 MED ORDER — DOXYCYCLINE HYCLATE 100 MG PO TABS
100.0000 mg | ORAL_TABLET | Freq: Once | ORAL | Status: AC
  Administered 2017-05-05: 100 mg via ORAL
  Filled 2017-05-05: qty 1

## 2017-05-05 MED ORDER — DOXYCYCLINE HYCLATE 50 MG PO CAPS: 100.0000 mg | ORAL_CAPSULE | Freq: Two times a day (BID) | ORAL | 0 refills | Status: AC

## 2017-05-05 MED ORDER — CEPHALEXIN 500 MG PO CAPS
500.0000 mg | ORAL_CAPSULE | Freq: Once | ORAL | Status: AC
  Administered 2017-05-05: 500 mg via ORAL
  Filled 2017-05-05: qty 1

## 2017-05-05 MED ORDER — CEPHALEXIN 500 MG PO CAPS: 500.0000 mg | ORAL_CAPSULE | Freq: Four times a day (QID) | ORAL | 0 refills | Status: AC

## 2017-05-05 NOTE — Discharge Instructions (Signed)
I need you to see a doctor to see her arm in 2 days for recheck. If you are unable to get an appointment to see your primary care physician in 2 days please come back to our emergency department for recheck. Return sooner for any new or worsening symptoms such as fevers, chills, worsening pain, or for any other concerns.  It was a pleasure to take care of you today, and thank you for coming to our emergency department.  If you have any questions or concerns before leaving please ask the nurse to grab me and I'm more than happy to go through your aftercare instructions again.  If you were prescribed any opioid pain medication today such as Norco, Vicodin, Percocet, morphine, hydrocodone, or oxycodone please make sure you do not drive when you are taking this medication as it can alter your ability to drive safely.  If you have any concerns once you are home that you are not improving or are in fact getting worse before you can make it to your follow-up appointment, please do not hesitate to call 911 and come back for further evaluation.  Merrily Brittle MD  Results for orders placed or performed during the hospital encounter of 05/05/17  CBC  Result Value Ref Range   WBC 6.1 3.8 - 10.6 K/uL   RBC 4.23 (L) 4.40 - 5.90 MIL/uL   Hemoglobin 13.4 13.0 - 18.0 g/dL   HCT 19.1 (L) 47.8 - 29.5 %   MCV 89.9 80.0 - 100.0 fL   MCH 31.7 26.0 - 34.0 pg   MCHC 35.3 32.0 - 36.0 g/dL   RDW 62.1 30.8 - 65.7 %   Platelets 238 150 - 440 K/uL  Comprehensive metabolic panel  Result Value Ref Range   Sodium 135 135 - 145 mmol/L   Potassium 4.2 3.5 - 5.1 mmol/L   Chloride 101 101 - 111 mmol/L   CO2 30 22 - 32 mmol/L   Glucose, Bld 141 (H) 65 - 99 mg/dL   BUN 13 6 - 20 mg/dL   Creatinine, Ser 8.46 0.61 - 1.24 mg/dL   Calcium 8.7 (L) 8.9 - 10.3 mg/dL   Total Protein 7.2 6.5 - 8.1 g/dL   Albumin 3.8 3.5 - 5.0 g/dL   AST 16 15 - 41 U/L   ALT 15 (L) 17 - 63 U/L   Alkaline Phosphatase 97 38 - 126 U/L   Total  Bilirubin 0.4 0.3 - 1.2 mg/dL   GFR calc non Af Amer >60 >60 mL/min   GFR calc Af Amer >60 >60 mL/min   Anion gap 4 (L) 5 - 15   US Venous Img Upper Uni Left  Result Date: 05/05/2017 CLINICAL DATA:  Left hand swelling yesterday. Now all arm is red and painful. EXAM: LEFT UPPER EXTREMITY VENOUS DOPPLER ULTRASOUND TECHNIQUE: Gray-scale sonography with graded compression, as well as color Doppler and duplex ultrasound were performed to evaluate the upper extremity deep venous system from the level of the subclavian vein and including the jugular, axillary, basilic, radial, ulnar and upper cephalic vein. Spectral Doppler was utilized to evaluate flow at rest and with distal augmentation maneuvers. COMPARISON:  None. FINDINGS: Contralateral Subclavian Vein: Respiratory phasicity is normal and symmetric with the symptomatic side. No evidence of thrombus. Normal compressibility. Internal Jugular Vein: No evidence of thrombus. Normal compressibility, respiratory phasicity and response to augmentation. Subclavian Vein: No evidence of thrombus. Normal compressibility, respiratory phasicity and response to augmentation. Axillary Vein: No evidence of thrombus. Normal compressibility,  respiratory phasicity and response to augmentation. Cephalic Vein: No evidence of thrombus. Normal compressibility, respiratory phasicity and response to augmentation. Basilic Vein: No evidence of thrombus. Normal compressibility, respiratory phasicity and response to augmentation. Brachial Veins: No evidence of thrombus. Normal compressibility, respiratory phasicity and response to augmentation. Radial Veins: No evidence of thrombus. Normal compressibility, respiratory phasicity and response to augmentation. Ulnar Veins: No evidence of thrombus. Normal compressibility, respiratory phasicity and response to augmentation. Venous Reflux:  None visualized. Other Findings: There is a heterogeneous masslike area along the dorsal aspect of the  hand, which appears inflammatory, possibly poorly defined abscess. IMPRESSION: No evidence of DVT within the left upper extremity. Electronically Signed   By: Amie Portlandavid  Ormond M.D.   On: 05/05/2017 20:33

## 2017-05-05 NOTE — ED Provider Notes (Signed)
[facility] Emergency Department Provider Note  ____________________________________________   First MD Initiated Contact with Patient 05/05/17 2157     (approximate)  I have reviewed the triage vital signs and the nursing notes.   HISTORY  Chief Complaint Arm Swelling   HPI Nicholas Delgado is a 36 y.o. male who comes to the emergency department with several days of painful swelling to his left arm. He denies trauma. He does work in [employer] and is noted several scratches to the distal aspect of his left arm recently. He is right-hand dominant.He has a history of diabetes. He denies IV drug use. He denies fevers or chills.   Past Medical History:  Diagnosis Date  . Hypertension     Patient Active Problem List   Diagnosis Date Noted  . Tobacco use disorder 08/25/2015  . Opioid use with withdrawal (HCC) 08/25/2015  . 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 HTN (hypertension) 08/25/2015  . Dyslipidemia 08/25/2015  . Opioid use disorder, severe, dependence (HCC) 08/24/2015    Past Surgical History:  Procedure Laterality Date  . arm surgery Left     Prior to Admission medications   Medication Sig Start Date End Date Taking? Authorizing Provider  acetaminophen-codeine (TYLENOL #3) 300-30 MG tablet Take 1-2 tablets by mouth every 4 (four) hours as needed for moderate pain. 08/23/16   Triplett, Cari B, FNP  atorvastatin (LIPITOR) 20 MG tablet Take 1 tablet by mouth daily. 01/31/15 01/31/16  [provider]  carvedilol (COREG) 6.25 MG tablet Take 6.25 mg by mouth 2 (two) times daily with a meal.    [provider]  cephALEXin (KEFLEX) 500 MG capsule Take 1 capsule (500 mg total) by mouth 4 (four) times daily. 05/05/17 05/15/17  Merrily Brittleifenbark, Ranette Luckadoo, MD  doxycycline (VIBRAMYCIN) 50 MG capsule Take 2 capsules (100 mg total) by mouth 2 (two) times daily. 05/05/17 05/15/17  Merrily Brittleifenbark, Erminie Foulks, MD    gabapentin (NEURONTIN) 300 MG capsule Take 900 mg by mouth 4 (four) times daily.    [provider]  indomethacin (INDOCIN) 50 MG capsule Take 1 capsule (50 mg total) by mouth 3 (three) times daily with meals. 12/26/15   Menshew, Charlesetta IvoryJenise V Bacon, PA-C  lisinopril (PRINIVIL,ZESTRIL) 20 MG tablet Take 20 mg by mouth daily.    [provider]  meloxicam (MOBIC) 15 MG tablet Take 1 tablet (15 mg total) by mouth daily. 08/23/16   Triplett, Rulon Eisenmengerari B, FNP  traZODone (DESYREL) 150 MG tablet Take 1 tablet (150 mg total) by mouth at bedtime as needed for sleep. 08/26/15   Jimmy FootmanHernandez-Gonzalez, Andrea, MD    Allergies Sulfa antibiotics  History reviewed. No pertinent family history.  Social History Social History  Substance Use Topics  . Smoking status: Current Every Day Smoker    Packs/day: 2.00  . Smokeless tobacco: Never Used  . Alcohol use No    Review of Systems Constitutional: No fever/chills ENT: No sore throat. Cardiovascular: Denies chest pain. Respiratory: Denies shortness of breath. Gastrointestinal: No abdominal pain.  No nausea, no vomiting.  No diarrhea.  No constipation. Musculoskeletal: Negative for back pain. Neurological: Negative for headaches   ____________________________________________   PHYSICAL EXAM:  VITAL SIGNS: ED Triage Vitals [05/05/17 1933]  Enc Vitals Group     BP 140/89     Pulse Rate 87     Resp 16     Temp 98.3 F (36.8 C)     Temp Source Oral  SpO2 99 %     Weight 200 lb (90.7 kg)     Height 6' (1.829 m)     Head Circumference      Peak Flow      Pain Score 7     Pain Loc      Pain Edu?      Excl. in GC?     Constitutional: Alert and oriented x 4 well appearing nontoxic no diaphoresis speaks in full, clear sentences Head: Atraumatic. Nose: No congestion/rhinnorhea. Mouth/Throat: No trismus Neck: No stridor.   Cardiovascular: Regular rate and rhythm no murmurs Respiratory: Normal respiratory effort.  No  retractions. Gastrointestinal: Soft nontender Muscular skeletal: Left upper extremity with 15 cm area of erythema and warmth with mild tenderness. No crepitus. No bulla blisters or sloughing Neurologic:  Normal speech and language. No gross focal neurologic deficits are appreciated.  Skin:  Skin is warm, dry and intact. No rash noted.    ____________________________________________  LABS (all labs ordered are listed, but only abnormal results are displayed)  Labs Reviewed  CBC - Abnormal; Notable for the following:       Result Value   RBC 4.23 (*)    HCT 38.0 (*)    All other components within normal limits  COMPREHENSIVE METABOLIC PANEL - Abnormal; Notable for the following:    Glucose, Bld 141 (*)    Calcium 8.7 (*)    ALT 15 (*)    Anion gap 4 (*)    All other components within normal limits    Labs unremarkable __________________________________________  EKG   ____________________________________________  RADIOLOGY  Ultrasound with no signs of DVT ____________________________________________   PROCEDURES  Procedure(s) performed: no  Procedures  Critical Care performed: no  Observation: no ____________________________________________   INITIAL IMPRESSION / ASSESSMENT AND PLAN / ED COURSE  Pertinent labs & imaging results that were available during my care of the patient were reviewed by me and considered in my medical decision making (see chart for details).  The patient is well-appearing and hemodynamically stable with tender erythema along the medial aspect of his left arm along with several scratches distally. This likely represents cellulitis. He is anaphylactic to Bactrim so we'll treat him with doxycycline instead for MRSA and Keflex for strep. First doses now and 2 day wound check. He is discharged home in improved condition.      ____________________________________________   FINAL CLINICAL IMPRESSION(S) / ED DIAGNOSES  Final diagnoses:   Cellulitis of left upper extremity      NEW MEDICATIONS STARTED DURING THIS VISIT:  Discharge Medication List as of 05/05/2017 10:23 PM    START taking these medications   Details  cephALEXin (KEFLEX) 500 MG capsule Take 1 capsule (500 mg total) by mouth 4 (four) times daily., Starting Thu 05/05/2017, Until Sun 05/15/2017, Print    doxycycline (VIBRAMYCIN) 50 MG capsule Take 2 capsules (100 mg total) by mouth 2 (two) times daily., Starting Thu 05/05/2017, Until Sun 05/15/2017, Print         Note:  This document was prepared using Dragon voice recognition software and may include unintentional dictation errors.      Merrily Brittle, MD 05/06/17 1302

## 2017-05-05 NOTE — ED Triage Notes (Signed)
Pt ambulatory to triage in NAD, report swelling to left hand starting yesterday, today redness/tenderness to left upper arm.  Small lump palpable in reddened area.  Superficial scratches to forearm from work, but no scratches in red area.  Pt describes pain as dull and intermittent sharp/shooting pains.

## 2017-05-15 ENCOUNTER — Emergency Department
Admission: EM | Admit: 2017-05-15 | Discharge: 2017-05-15 | Disposition: A | Discharge status: Home / Self Care | Payer: Medicaid Other | Attending: Emergency Medicine | Admitting: Emergency Medicine

## 2017-05-15 ENCOUNTER — Emergency Department: Payer: Medicaid Other

## 2017-05-15 DIAGNOSIS — Z79899 Other long term (current) drug therapy: Secondary | ICD-10-CM | POA: Diagnosis not present

## 2017-05-15 DIAGNOSIS — R21 Rash and other nonspecific skin eruption: Secondary | ICD-10-CM | POA: Insufficient documentation

## 2017-05-15 DIAGNOSIS — M130 Polyarthritis, unspecified: Secondary | ICD-10-CM

## 2017-05-15 DIAGNOSIS — F17200 Nicotine dependence, unspecified, uncomplicated: Secondary | ICD-10-CM | POA: Diagnosis not present

## 2017-05-15 LAB — CBC
HCT: 39.6 % — ABNORMAL LOW (ref 40.0–52.0)
HEMOGLOBIN: 13.9 g/dL (ref 13.0–18.0)
MCH: 31.3 pg (ref 26.0–34.0)
MCHC: 35.1 g/dL (ref 32.0–36.0)
MCV: 89.3 fL (ref 80.0–100.0)
Platelets: 213 10*3/uL (ref 150–440)
RBC: 4.43 MIL/uL (ref 4.40–5.90)
RDW: 14.3 % (ref 11.5–14.5)
WBC: 9.7 10*3/uL (ref 3.8–10.6)

## 2017-05-15 LAB — BASIC METABOLIC PANEL
ANION GAP: 10 (ref 5–15)
BUN: 11 mg/dL (ref 6–20)
CALCIUM: 9 mg/dL (ref 8.9–10.3)
CO2: 24 mmol/L (ref 22–32)
Chloride: 101 mmol/L (ref 101–111)
Creatinine, Ser: 0.66 mg/dL (ref 0.61–1.24)
GFR calc Af Amer: >60 mL/min (ref >60)
GLUCOSE: 127 mg/dL — AB (ref 65–99)
Potassium: 3.8 mmol/L (ref 3.5–5.1)
SODIUM: 135 mmol/L (ref 135–145)

## 2017-05-15 LAB — SEDIMENTATION RATE: SED RATE: 24 mm/h — AB (ref 0–15)

## 2017-05-15 NOTE — ED Notes (Signed)
Pt discharged to home.  Family member driving.  Discharge instructions reviewed.  Verbalized understanding.  No questions or concerns at this time.  Teach back verified.  Pt in NAD.  No items left in ED.   

## 2017-05-15 NOTE — Discharge Instructions (Signed)
We have sent some blood work today that will not be followed up by the emergency department, he will be followed up by her primary care doctor. Please follow up with her primary care doctor first thing on Tuesday. He will also need to see Dr. Magdalene MollyKerrnodle of rheumatology in addition to whatever dermatologist they have recommended that you see. I would ask of you to return to the emergency department if there is any new or worrisome symptoms including fever, increased swelling, shortness of breath, worsening rash or other concerns.

## 2017-05-15 NOTE — ED Triage Notes (Signed)
Pt presents to ED c/o cellulitis in right hand and arm in which he was prescribed antibiotics. Presents concern with acute confusion and forgetfulness per spouse. Also complains of rash on hands and feet with swelling on knees and ankles.

## 2017-05-15 NOTE — ED Provider Notes (Signed)
[facility] Emergency Department Provider Note  ____________________________________________   I have reviewed the triage vital signs and the nursing notes.   HISTORY  Chief Complaint Recurrent Skin Infections; Rash; and Memory Loss    HPI GILBERTO STANFORTH is a 36 y.o. male who presents today complaining of multiple different complaints. Patient has had for the last several years multiple different episodes of polyarthritis and he has been seen here for pain in his joints without any trauma multiple times. He states he has also had for the last 6-8 months desquamation of his hands and soles, with no other symptoms, and he also has redness to bilateral forearms, and swelling to bilateral arms it comes and goes over the last several months. He was seen hereand tried on antibiotics in case there was a cellulitis he has had no resolution of symptoms however and he feels that the antibiotic is making him feel little bit confused though he is not confused right now. Family states that sometimes she seems to lose track of conversations after taking the antibiotic and then shortly thereafter is back to normal. His been no focal numbness or weakness or stroke symptoms he has no headache he has no change in vision he has no oral involvement of any of his rash. He denies any new medications but he is on multiple medications which she has been on for some time. He states his knees and his ankles bilaterally in addition to both wrists feel somewhat swollen and uncomfortable. He has had no trauma.    Past Medical History:  Diagnosis Date  . Hypertension     Patient Active Problem List   Diagnosis Date Noted  . Tobacco use disorder 08/25/2015  . Opioid use with withdrawal (HCC) 08/25/2015  . 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 HTN (hypertension) 08/25/2015  . Dyslipidemia 08/25/2015  . Opioid use disorder, severe, dependence  (HCC) 08/24/2015    Past Surgical History:  Procedure Laterality Date  . arm surgery Left     Prior to Admission medications   Medication Sig Start Date End Date Taking? Authorizing Provider  acetaminophen-codeine (TYLENOL #3) 300-30 MG tablet Take 1-2 tablets by mouth every 4 (four) hours as needed for moderate pain. 08/23/16   Triplett, Cari B, FNP  atorvastatin (LIPITOR) 20 MG tablet Take 1 tablet by mouth daily. 01/31/15 01/31/16  [provider]  carvedilol (COREG) 6.25 MG tablet Take 6.25 mg by mouth 2 (two) times daily with a meal.    [provider]  cephALEXin (KEFLEX) 500 MG capsule Take 1 capsule (500 mg total) by mouth 4 (four) times daily. 05/05/17 05/15/17  Merrily Brittle, MD  doxycycline (VIBRAMYCIN) 50 MG capsule Take 2 capsules (100 mg total) by mouth 2 (two) times daily. 05/05/17 05/15/17  Merrily Brittle, MD  gabapentin (NEURONTIN) 300 MG capsule Take 900 mg by mouth 4 (four) times daily.    [provider]  indomethacin (INDOCIN) 50 MG capsule Take 1 capsule (50 mg total) by mouth 3 (three) times daily with meals. 12/26/15   Menshew, Charlesetta Ivory, PA-C  lisinopril (PRINIVIL,ZESTRIL) 20 MG tablet Take 20 mg by mouth daily.    [provider]  meloxicam (MOBIC) 15 MG tablet Take 1 tablet (15 mg total) by mouth daily. 08/23/16   Triplett, Rulon Eisenmenger B, FNP  traZODone (DESYREL) 150 MG tablet Take 1 tablet (150 mg total) by mouth at bedtime as needed for sleep. 08/26/15   Jimmy Footman, MD  Allergies Sulfa antibiotics  No family history on file.  Social History Social History  Substance Use Topics  . Smoking status: Current Every Day Smoker    Packs/day: 2.00  . Smokeless tobacco: Never Used  . Alcohol use No    Review of Systems Constitutional: No fever/chills Eyes: No visual changes. ENT: No sore throat. No stiff neck no neck pain Cardiovascular: Denies chest pain. Respiratory: Denies shortness of breath. Gastrointestinal:    no vomiting.  No diarrhea.  No constipation. Genitourinary: Negative for dysuria. Musculoskeletal: Negative lower extremity swelling Skin: Negative for rash. Neurological: Negative for severe headaches, focal weakness or numbness.   ____________________________________________   PHYSICAL EXAM:  VITAL SIGNS: ED Triage Vitals [05/15/17 1809]  Enc Vitals Group     BP 119/81     Pulse Rate (!) 105     Resp 19     Temp 98 F (36.7 C)     Temp Source Oral     SpO2 98 %     Weight 200 lb (90.7 kg)     Height 6' (1.829 m)     Head Circumference      Peak Flow      Pain Score      Pain Loc      Pain Edu?      Excl. in GC?     Constitutional: Alert and oriented. Well appearing and in no acute distress. Eyes: Conjunctivae are normal Head: Atraumatic HEENT: No congestion/rhinnorhea. Mucous membranes are moist.  Oropharynx non-erythematous Neck:   Nontender with no meningismus, no masses, no stridor Cardiovascular: Normal rate, regular rhythm. Grossly normal heart sounds.  Good peripheral circulation. Respiratory: Normal respiratory effort.  No retractions. Lungs CTAB. Abdominal: Soft and nontender. No distention. No guarding no rebound Back:  There is no focal tenderness or step off.  there is no midline tenderness there are no lesions noted. there is no CVA tenderness Musculoskeletal: Patient with a red dermatitis like could've confluent blanchable rash to both forearms which is in a sinus to be treated area and stops at the sleeves. There is no abscess or induration, there is no proximal lymphadenopathy noted, he also has mild right swelling and more significant left-handed swelling which is chronic in nature. He has strong pulses in both areas. There is no joint effusion of any significance noted in the ankles or knees but he states they're tender to ranging. They're not red or hot to touch. There is no evidence of joint infection in any part of his body. DVT signs strong distal  pulses no edema Neurologic:  Normal speech and language. No gross focal neurologic deficits are appreciated.  Skin:  Skin is warm, dry and intact. There is some mild desquamation to the hands and soles bilaterally, there is no rash otherwise noted on hands and soles. There is also a sunburn like rash to bilateral forearms which is blanchable see above Psychiatric: Mood and affect are normal. Speech and behavior are normal.  ____________________________________________   LABS (all labs ordered are listed, but only abnormal results are displayed)  Labs Reviewed  BASIC METABOLIC PANEL - Abnormal; Notable for the following:       Result Value   Glucose, Bld 127 (*)    All other components within normal limits  CBC - Abnormal; Notable for the following:    HCT 39.6 (*)    All other components within normal limits  URINALYSIS, COMPLETE (UACMP) WITH MICROSCOPIC  SEDIMENTATION RATE  RPR  ANTISTREPTOLYSIN O TITER  ____________________________________________  EKG  I personally interpreted any EKGs ordered by me or triage  ____________________________________________  RADIOLOGY  I reviewed any imaging ordered by me or triage that were performed during my shift and, if possible, patient and/or family made aware of any abnormal findings. ____________________________________________   PROCEDURES  Procedure(s) performed: None  Procedures  Critical Care performed: None  ____________________________________________   INITIAL IMPRESSION / ASSESSMENT AND PLAN / ED COURSE  Pertinent labs & imaging results that were available during my care of the patient were reviewed by me and considered in my medical decision making (see chart for details).  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 We will stop the antibiotic. Patient is not having any evidence of confusion or CVA and he is at his baseline. The symptoms have been going on off and on for  several years. Patient will require dermatology and already has a referral to them. He will also require I think more importantly rheumatology. I did discuss with Dr. Cephus Shellingrummet, on call for Dr. Graciela HusbandsKlein, that we are ordering ASO titers, sedimentation rate and RPR, but that we would not be following up on them in the emergency room, and he states his primary care doctor, Dr. Graciela HusbandsKlein, and himself will follow-up. I will refer him to rheumatology, and the patient is very comfortable with all this plan. Again this is a chronic pathology, he could be having some sort of a drug reaction but again I see symptoms going back to 2016. I don't think any emergent condition requiring admission is indicated is nothing to suggest acute infection there is nothing to suggest gonorrheal infection there is nothing to suggest any decompensated rheumatologic pathology nor is there any evidence of Stevens-Johnson's or TEN. Return precautions and follow-up given and understood. He is eager to go home.    ____________________________________________   FINAL CLINICAL IMPRESSION(S) / ED DIAGNOSES  Final diagnoses:  None      This chart was dictated using voice recognition software.  Despite best efforts to proofread,  errors can occur which can change meaning.      Jeanmarie PlantMcShane, Sunita Demond A, MD 05/15/17 2107

## 2017-05-17 LAB — ANTISTREPTOLYSIN O TITER: ASO: 44 IU/mL (ref 0.0–200.0)

## 2017-05-17 LAB — RPR: RPR Ser Ql: NONREACTIVE

## 2017-10-05 ENCOUNTER — Emergency Department: Payer: Medicaid Other

## 2017-10-05 ENCOUNTER — Encounter: Payer: Self-pay | Admitting: Emergency Medicine

## 2017-10-05 ENCOUNTER — Emergency Department
Admission: EM | Admit: 2017-10-05 | Discharge: 2017-10-05 | Disposition: A | Discharge status: Home / Self Care | Payer: Medicaid Other | Attending: Emergency Medicine | Admitting: Emergency Medicine

## 2017-10-05 DIAGNOSIS — I10 Essential (primary) hypertension: Secondary | ICD-10-CM | POA: Insufficient documentation

## 2017-10-05 DIAGNOSIS — F17200 Nicotine dependence, unspecified, uncomplicated: Secondary | ICD-10-CM | POA: Insufficient documentation

## 2017-10-05 DIAGNOSIS — J209 Acute bronchitis, unspecified: Secondary | ICD-10-CM | POA: Diagnosis not present

## 2017-10-05 DIAGNOSIS — Z791 Long term (current) use of non-steroidal anti-inflammatories (NSAID): Secondary | ICD-10-CM | POA: Insufficient documentation

## 2017-10-05 DIAGNOSIS — R05 Cough: Secondary | ICD-10-CM | POA: Diagnosis present

## 2017-10-05 DIAGNOSIS — Z79899 Other long term (current) drug therapy: Secondary | ICD-10-CM | POA: Insufficient documentation

## 2017-10-05 MED ORDER — AZITHROMYCIN 250 MG PO TABS: ORAL_TABLET | ORAL | 0 refills | Status: AC

## 2017-10-05 MED ORDER — PREDNISONE 50 MG PO TABS: ORAL_TABLET | ORAL | 0 refills | Status: DC

## 2017-10-05 NOTE — ED Notes (Signed)
Patient presents to the ED with cough and congestion for over a week.  Patient reports he had a fever and an episode of vomiting last week but none this week.  Patient is in no obvious distress at this time.

## 2017-10-05 NOTE — ED Provider Notes (Signed)
[facility] Emergency Department Provider Note  ____________________________________________  Time seen: Approximately 3:47 PM  I have reviewed the triage vital signs and the nursing notes.   HISTORY  Chief Complaint URI    HPI Nicholas Delgado is a 36 y.o. male presents to the emergency department with productive cough for green sputum production for the past 3 weeks. Patient states that he has experienced intermittent shortness of breath. He is a daily smoker. He denies fatigue and fever. He reports the cough is keeping him up at night. He denies hemoptysis, weight loss and night sweats. No recent travel. He is tolerating fluids and food by mouth. He denies chest pain, chest tightness, shortness of breath, nausea, vomiting or abdominal pain.   Past Medical History:  Diagnosis Date  . Hypertension     Patient Active Problem List   Diagnosis Date Noted  . Tobacco use disorder 08/25/2015  . Opioid use with withdrawal (HCC) 08/25/2015  . 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 HTN (hypertension) 08/25/2015  . Dyslipidemia 08/25/2015  . Opioid use disorder, severe, dependence (HCC) 08/24/2015    Past Surgical History:  Procedure Laterality Date  . arm surgery Left     Prior to Admission medications   Medication Sig Start Date End Date Taking? Authorizing Provider  acetaminophen-codeine (TYLENOL #3) 300-30 MG tablet Take 1-2 tablets by mouth every 4 (four) hours as needed for moderate pain. 08/23/16   Triplett, Cari B, FNP  atorvastatin (LIPITOR) 20 MG tablet Take 1 tablet by mouth daily. 01/31/15 01/31/16  [provider]  azithromycin (ZITHROMAX Z-PAK) 250 MG tablet Take 2 tablets (500 mg) on  Day 1,  followed by 1 tablet (250 mg) once daily on Days 2 through 5. 10/05/17 10/10/17  Orvil Feil, PA-C  carvedilol (COREG) 6.25 MG tablet Take 6.25 mg by mouth 2 (two) times daily with a meal.    [provider]  gabapentin (NEURONTIN) 300 MG capsule Take 900 mg by mouth 4 (four) times daily.    [provider]  indomethacin (INDOCIN) 50 MG capsule Take 1 capsule (50 mg total) by mouth 3 (three) times daily with meals. 12/26/15   Menshew, Charlesetta Ivory, PA-C  lisinopril (PRINIVIL,ZESTRIL) 20 MG tablet Take 20 mg by mouth daily.    [provider]  meloxicam (MOBIC) 15 MG tablet Take 1 tablet (15 mg total) by mouth daily. 08/23/16   Triplett, Cari B, FNP  predniSONE (DELTASONE) 50 MG tablet Take one tablet daily by mouth for the next five days. 10/05/17   Orvil Feil, PA-C  traZODone (DESYREL) 150 MG tablet Take 1 tablet (150 mg total) by mouth at bedtime as needed for sleep. 08/26/15   Jimmy Footman, MD    Allergies Sulfa antibiotics  No family history on file.  Social History Social History  Substance Use Topics  . Smoking status: Current Every Day Smoker    Packs/day: 2.00  . Smokeless tobacco: Never Used  . Alcohol use No    Review of Systems  Constitutional: No fever/chills Eyes: No visual changes. No discharge ENT: No upper respiratory complaints. Cardiovascular: no chest pain. Respiratory: Patient has productive cough.  Gastrointestinal: No abdominal pain.  No nausea, no vomiting.  No diarrhea.  No constipation. Musculoskeletal: Negative for musculoskeletal pain. Skin: Negative for rash, abrasions, lacerations, ecchymosis. Neurological: Negative for headaches, focal weakness or numbness. ____________________________________________   PHYSICAL EXAM:  VITAL SIGNS: ED Triage Vitals  Enc Vitals  Group     BP 10/05/17 1450 126/77     Pulse Rate 10/05/17 1450 85     Resp 10/05/17 1450 18     Temp 10/05/17 1450 98.8 F (37.1 C)     Temp Source 10/05/17 1450 Oral     SpO2 10/05/17 1450 95 %     Weight 10/05/17 1449 203 lb (92.1 kg)     Height 10/05/17 1449 6' (1.829 m)     Head Circumference --      Peak Flow --      Pain Score --       Pain Loc --      Pain Edu? --      Excl. in [place]? --     Constitutional: Alert and oriented. Well appearing and in no acute distress. Eyes: Conjunctivae are normal. PERRL. EOMI. Head: Atraumatic. ENT:      Ears: Right tympanic membrane is effused. Left tympanic membrane is scarred      Nose: Nasal turbinates are edematous.      Mouth/Throat: Mucous membranes are moist.  Hematological/Lymphatic/Immunilogical: Palpable cervical lymphadenopathy.  Cardiovascular: Normal rate, regular rhythm. Normal S1 and S2.  Good peripheral circulation. Respiratory: Normal respiratory effort without tachypnea or retractions. Diffuse wheezing auscultated bilaterally. Good air entry to the bases with no decreased or absent breath sounds. Skin:  Skin is warm, dry and intact. No rash noted. Psychiatric: Mood and affect are normal. Speech and behavior are normal. Patient exhibits appropriate insight and judgement.   ____________________________________________   LABS (all labs ordered are listed, but only abnormal results are displayed)  Labs Reviewed - No data to display ____________________________________________  EKG   ____________________________________________  RADIOLOGY Geraldo Pitter, personally viewed and evaluated these images (plain radiographs) as part of my medical decision making, as well as reviewing the written report by the radiologist.    Dg Chest 2 View  Result Date: 10/05/2017 CLINICAL DATA:  Patient presents to the ED with cough and congestion for over a week. Patient reports he had a fever and an episode of vomiting last week but none this week. HTN, smoker- 2 packs/ day No sx EXAM: CHEST  2 VIEW COMPARISON:  06/07/2009 FINDINGS: Mild hyperinflation. Mild right hemidiaphragm elevation. Midline trachea. Normal heart size and mediastinal contours. No pleural effusion or pneumothorax. Diffuse peribronchial thickening. Clear lungs. IMPRESSION: 1.  No acute cardiopulmonary  disease. 2. Mild peribronchial thickening which may relate to chronic bronchitis or smoking. Electronically Signed   By: Jeronimo Greaves M.D.   On: 10/05/2017 16:16    ____________________________________________    PROCEDURES  Procedure(s) performed:    Procedures    Medications - No data to display   ____________________________________________   INITIAL IMPRESSION / ASSESSMENT AND PLAN / ED COURSE  Pertinent labs & imaging results that were available during my care of the patient were reviewed by me and considered in my medical decision making (see chart for details).  Review of the [place] CSRS was performed in accordance of the [facility] prior to dispensing any controlled drugs.     Assessment and plan Acute bronchitis Differential diagnosis includes acute bronchitis versus community acquired pneumonia. Patient presents to the emergency department with productive cough and intermittent shortness of breath for the past 3 weeks. Absence of fever and fatigue make bronchitis likely diagnosis. Chest x-ray reveals no consolidations or findings consistent with pneumonia. Patient was discharged with azithromycin and prednisone. Patient adamantly declined DuoNeb treatment in the emergency department stating that it made  him feel "funny". Patient was advised DuoNeb treatment would really improve wheezing. Patient continued to decline DuoNeb treatment. Patient was advised to return to the emergency department for new or worsening symptoms. Vital signs are reassuring prior to discharge.    ____________________________________________  FINAL CLINICAL IMPRESSION(S) / ED DIAGNOSES  Final diagnoses:  Acute bronchitis, unspecified organism      NEW MEDICATIONS STARTED DURING THIS VISIT:  Discharge Medication List as of 10/05/2017  4:31 PM    START taking these medications   Details  azithromycin (ZITHROMAX Z-PAK) 250 MG tablet Take 2 tablets (500 mg) on  Day 1,  followed by 1 tablet (250  mg) once daily on Days 2 through 5., Print    predniSONE (DELTASONE) 50 MG tablet Take one tablet daily by mouth for the next five days., Print            This chart was dictated using voice recognition software/Dragon. Despite best efforts to proofread, errors can occur which can change the meaning. Any change was purely unintentional.    Orvil FeilWoods, Avraj Lindroth M, PA-C 10/05/17 1659    Dionne BucySiadecki, Sebastian, MD 10/05/17 [phone]

## 2017-10-05 NOTE — ED Triage Notes (Signed)
Cough and sinus congestion and wheezing x 2 days.

## 2019-12-04 ENCOUNTER — Encounter: Payer: Self-pay | Admitting: Emergency Medicine

## 2019-12-04 ENCOUNTER — Emergency Department
Admission: EM | Admit: 2019-12-04 | Discharge: 2019-12-04 | Disposition: A | Discharge status: Home / Self Care | Payer: Medicaid Other | Attending: Student | Admitting: Student

## 2019-12-04 ENCOUNTER — Emergency Department: Payer: Medicaid Other

## 2019-12-04 ENCOUNTER — Other Ambulatory Visit: Payer: Self-pay

## 2019-12-04 DIAGNOSIS — F17200 Nicotine dependence, unspecified, uncomplicated: Secondary | ICD-10-CM | POA: Insufficient documentation

## 2019-12-04 DIAGNOSIS — S93402A Sprain of unspecified ligament of left ankle, initial encounter: Secondary | ICD-10-CM | POA: Diagnosis not present

## 2019-12-04 DIAGNOSIS — I10 Essential (primary) hypertension: Secondary | ICD-10-CM | POA: Diagnosis not present

## 2019-12-04 DIAGNOSIS — Y939 Activity, unspecified: Secondary | ICD-10-CM | POA: Diagnosis not present

## 2019-12-04 DIAGNOSIS — Y929 Unspecified place or not applicable: Secondary | ICD-10-CM | POA: Diagnosis not present

## 2019-12-04 DIAGNOSIS — Z79899 Other long term (current) drug therapy: Secondary | ICD-10-CM | POA: Insufficient documentation

## 2019-12-04 DIAGNOSIS — Y999 Unspecified external cause status: Secondary | ICD-10-CM | POA: Diagnosis not present

## 2019-12-04 DIAGNOSIS — X501XXA Overexertion from prolonged static or awkward postures, initial encounter: Secondary | ICD-10-CM | POA: Insufficient documentation

## 2019-12-04 DIAGNOSIS — S99912A Unspecified injury of left ankle, initial encounter: Secondary | ICD-10-CM | POA: Diagnosis present

## 2019-12-04 MED ORDER — NAPROXEN 500 MG PO TABS: 500.0000 mg | ORAL_TABLET | Freq: Two times a day (BID) | ORAL | 0 refills | Status: DC

## 2019-12-04 NOTE — ED Provider Notes (Signed)
[facility] Emergency Department Provider Note  ____________________________________________   First MD Initiated Contact with Patient 12/04/19 0732     (approximate)  I have reviewed the triage vital signs and the nursing notes.   HISTORY  Chief Complaint Ankle Injury    HPI Nicholas Delgado is a 38 y.o. male presents to the ED with complaint of ankle pain for the last 2 weeks.  Patient states that he rolled his ankle and since that time has had swelling and pain.  He has infrequently taken ibuprofen with some minimal relief.  He states he has been walking a great deal and has not given his ankle rest.  Patient has continued to be ambulatory since that time.  He rates his pain as an 8 out of 10.      Past Medical History:  Diagnosis Date  . Hypertension     Patient Active Problem List   Diagnosis Date Noted  . Tobacco use disorder 08/25/2015  . Opioid use with withdrawal (HCC) 08/25/2015  . 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 HTN (hypertension) 08/25/2015  . Dyslipidemia 08/25/2015  . Opioid use disorder, severe, dependence (HCC) 08/24/2015    Past Surgical History:  Procedure Laterality Date  . arm surgery Left     Prior to Admission medications   Medication Sig Start Date End Date Taking? Authorizing Provider  atorvastatin (LIPITOR) 20 MG tablet Take 1 tablet by mouth daily. 01/31/15 01/31/16  [provider]  carvedilol (COREG) 6.25 MG tablet Take 6.25 mg by mouth 2 (two) times daily with a meal.    [provider]  gabapentin (NEURONTIN) 300 MG capsule Take 900 mg by mouth 4 (four) times daily.    [provider]  lisinopril (PRINIVIL,ZESTRIL) 20 MG tablet Take 20 mg by mouth daily.    [provider]  naproxen (NAPROSYN) 500 MG tablet Take 1 tablet (500 mg total) by mouth 2 (two) times daily with a meal. 12/04/19   Tommi Rumps, PA-C  traZODone  (DESYREL) 150 MG tablet Take 1 tablet (150 mg total) by mouth at bedtime as needed for sleep. 08/26/15   Jimmy Footman, MD    Allergies Sulfa antibiotics  History reviewed. No pertinent family history.  Social History Social History   Tobacco Use  . Smoking status: Current Every Day Smoker    Packs/day: 2.00  . Smokeless tobacco: Never Used  Substance Use Topics  . Alcohol use: No  . Drug use: Yes    Comment: percocet 10mg     Review of Systems Constitutional: No fever/chills Cardiovascular: Denies chest pain. Respiratory: Denies shortness of breath. Gastrointestinal:   No nausea, no vomiting. Musculoskeletal: Positive for left ankle pain. Skin: Negative for rash. Neurological: Negative for headaches, focal weakness or numbness. ____________________________________________   PHYSICAL EXAM:  VITAL SIGNS: ED Triage Vitals  Enc Vitals Group     BP 12/04/19 0636 (!) 148/90     Pulse Rate 12/04/19 0636 96     Resp 12/04/19 0636 18     Temp 12/04/19 0636 98 F (36.7 C)     Temp Source 12/04/19 0636 Oral     SpO2 12/04/19 0636 98 %     Weight 12/04/19 0635 230 lb (104.3 kg)     Height 12/04/19 0635 6' (1.829 m)     Head Circumference --      Peak Flow --      Pain Score 12/04/19 0635 8  Pain Loc --      Pain Edu? --      Excl. in [place]? --    Constitutional: Alert and oriented. Well appearing and in no acute distress. Eyes: Conjunctivae are normal.  Head: Atraumatic. Neck: No stridor.   Cardiovascular: Normal rate, regular rhythm. Grossly normal heart sounds.  Good peripheral circulation. Respiratory: Normal respiratory effort.  No retractions. Lungs CTAB. Musculoskeletal: Examination of left ankle there is moderate tenderness on palpation of the medial aspect.  There is soft tissue edema along with edema on the dorsal aspect of the left foot.  On palpation there is no tenderness of the metatarsals or digits.  Patient is able to move digits without any  difficulty, motor sensory function intact.  Skin is intact. Neurologic:  Normal speech and language. No gross focal neurologic deficits are appreciated. No gait instability. Skin:  Skin is warm, dry and intact. No rash noted. Psychiatric: Mood and affect are normal. Speech and behavior are normal.  ____________________________________________   LABS (all labs ordered are listed, but only abnormal results are displayed)  Labs Reviewed - No data to display ____________________________________________  RADIOLOGY  Official radiology report(s): DG Ankle Complete Left  Result Date: 12/04/2019 CLINICAL DATA:  Pain and swelling after ankle injury 2 weeks ago EXAM: LEFT ANKLE COMPLETE - 3+ VIEW COMPARISON:  03/29/2011 FINDINGS: Soft tissue swelling and ankle joint effusion. There is an ossicle at the medial malleolus. Dorsal navicular spurring appears similar to prior study. No fracture or dislocation. IMPRESSION: Soft tissue swelling and joint effusion without acute osseous finding. Electronically Signed   By: Monte Fantasia M.D.   On: 12/04/2019 07:22    ____________________________________________   PROCEDURES  Procedure(s) performed (including Critical Care):  Procedures   ____________________________________________   INITIAL IMPRESSION / ASSESSMENT AND PLAN / ED COURSE  As part of my medical decision making, I reviewed the following data within the electronic MEDICAL RECORD NUMBER Notes from prior ED visits and [place] Controlled Substance Database  39 year old male presents to the ED with complaint of continued pain and swelling to his left ankle.  Patient states he injured it rolling his ankle 2 weeks ago.  He was taken ibuprofen infrequently with some minimal improvement of the edema.  He now is concerned that he may have a fracture.  Physical exam shows moderate soft tissue edema.  X-rays were negative for acute bony injury.  Patient was made aware.  He was placed in a stirrup ankle  splint for support and a prescription for naproxen 500 mg twice daily with food.  He is to follow-up with his PCP if any continued problems.   ____________________________________________   FINAL CLINICAL IMPRESSION(S) / ED DIAGNOSES  Final diagnoses:  Sprain of left ankle, unspecified ligament, initial encounter     ED Discharge Orders         Ordered    naproxen (NAPROSYN) 500 MG tablet  2 times daily with meals     12/04/19 0745           Note:  This document was prepared using Dragon voice recognition software and may include unintentional dictation errors.    Johnn Hai, PA-C 12/04/19 1516    Lilia Pro., MD 12/04/19 [phone]

## 2019-12-04 NOTE — ED Triage Notes (Signed)
Pt arrived via POV, states he drove himself to ED, pt reports ankle injury 2 weeks ago, pt c/o swelling and difficulty.   Pt states he rolled his ankle.

## 2019-12-04 NOTE — Discharge Instructions (Addendum)
Follow-up with your primary care provider if any continued problems.  Also ice and elevate your ankle and foot to reduce swelling and help with pain.  Begin taking naproxen 500 mg twice daily with food.  Wear ankle splint for added support and protection.

## 2019-12-04 NOTE — ED Notes (Signed)
See triage note  Presents with pain and swelling to left ankle  States he rolled his ankle about 2 weeks ago  Increased pain with flexion  Positive swelling   Good pulses

## 2020-06-26 ENCOUNTER — Emergency Department: Payer: Medicaid Other

## 2020-06-26 ENCOUNTER — Emergency Department
Admission: EM | Admit: 2020-06-26 | Discharge: 2020-06-27 | Disposition: A | Discharge status: Home / Self Care | Payer: Medicaid Other | Attending: Emergency Medicine | Admitting: Emergency Medicine

## 2020-06-26 ENCOUNTER — Encounter: Payer: Self-pay | Admitting: *Deleted

## 2020-06-26 ENCOUNTER — Other Ambulatory Visit: Payer: Self-pay

## 2020-06-26 DIAGNOSIS — F1110 Opioid abuse, uncomplicated: Secondary | ICD-10-CM | POA: Diagnosis not present

## 2020-06-26 DIAGNOSIS — F1319 Sedative, hypnotic or anxiolytic abuse with unspecified sedative, hypnotic or anxiolytic-induced disorder: Secondary | ICD-10-CM | POA: Insufficient documentation

## 2020-06-26 DIAGNOSIS — Z79899 Other long term (current) drug therapy: Secondary | ICD-10-CM | POA: Diagnosis not present

## 2020-06-26 DIAGNOSIS — I10 Essential (primary) hypertension: Secondary | ICD-10-CM | POA: Insufficient documentation

## 2020-06-26 DIAGNOSIS — F1310 Sedative, hypnotic or anxiolytic abuse, uncomplicated: Secondary | ICD-10-CM

## 2020-06-26 DIAGNOSIS — R45851 Suicidal ideations: Secondary | ICD-10-CM | POA: Diagnosis not present

## 2020-06-26 DIAGNOSIS — F17200 Nicotine dependence, unspecified, uncomplicated: Secondary | ICD-10-CM | POA: Diagnosis not present

## 2020-06-26 DIAGNOSIS — R4182 Altered mental status, unspecified: Secondary | ICD-10-CM | POA: Diagnosis present

## 2020-06-26 LAB — COMPREHENSIVE METABOLIC PANEL
ALT: 19 U/L (ref 0–44)
AST: 24 U/L (ref 15–41)
Albumin: 4.6 g/dL (ref 3.5–5.0)
Alkaline Phosphatase: 98 U/L (ref 38–126)
Anion gap: 11 (ref 5–15)
BUN: 14 mg/dL (ref 6–20)
CO2: 25 mmol/L (ref 22–32)
Calcium: 8.9 mg/dL (ref 8.9–10.3)
Chloride: 102 mmol/L (ref 98–111)
Creatinine, Ser: 0.79 mg/dL (ref 0.61–1.24)
GFR calc Af Amer: >60 mL/min (ref >60)
GFR calc non Af Amer: >60 mL/min (ref >60)
Glucose, Bld: 97 mg/dL (ref 70–99)
Potassium: 3.5 mmol/L (ref 3.5–5.1)
Sodium: 138 mmol/L (ref 135–145)
Total Bilirubin: 0.6 mg/dL (ref 0.3–1.2)
Total Protein: 7.6 g/dL (ref 6.5–8.1)

## 2020-06-26 LAB — URINALYSIS, COMPLETE (UACMP) WITH MICROSCOPIC
Bacteria, UA: NONE SEEN
Bilirubin Urine: NEGATIVE
Glucose, UA: NEGATIVE mg/dL
Ketones, ur: NEGATIVE mg/dL
Leukocytes,Ua: NEGATIVE
Nitrite: NEGATIVE
Protein, ur: NEGATIVE mg/dL
Specific Gravity, Urine: 1.02 (ref 1.005–1.030)
Squamous Epithelial / HPF: NONE SEEN (ref 0–5)
pH: 5 (ref 5.0–8.0)

## 2020-06-26 LAB — CBC
HCT: 39.8 % (ref 39.0–52.0)
Hemoglobin: 14 g/dL (ref 13.0–17.0)
MCH: 31.3 pg (ref 26.0–34.0)
MCHC: 35.2 g/dL (ref 30.0–36.0)
MCV: 88.8 fL (ref 80.0–100.0)
Platelets: 168 10*3/uL (ref 150–400)
RBC: 4.48 MIL/uL (ref 4.22–5.81)
RDW: 14.3 % (ref 11.5–15.5)
WBC: 9 10*3/uL (ref 4.0–10.5)
nRBC: 0 % (ref 0.0–0.2)

## 2020-06-26 LAB — URINE DRUG SCREEN, QUALITATIVE (ARMC ONLY)
Amphetamines, Ur Screen: NOT DETECTED
Barbiturates, Ur Screen: NOT DETECTED
Benzodiazepine, Ur Scrn: POSITIVE — AB
Cannabinoid 50 Ng, Ur ~~LOC~~: NOT DETECTED
Cocaine Metabolite,Ur ~~LOC~~: NOT DETECTED
MDMA (Ecstasy)Ur Screen: NOT DETECTED
Methadone Scn, Ur: NOT DETECTED
Opiate, Ur Screen: POSITIVE — AB
Phencyclidine (PCP) Ur S: NOT DETECTED
Tricyclic, Ur Screen: NOT DETECTED

## 2020-06-26 LAB — ETHANOL: Alcohol, Ethyl (B): <10 mg/dL (ref <10)

## 2020-06-26 LAB — ACETAMINOPHEN LEVEL: Acetaminophen (Tylenol), Serum: <10 ug/mL — ABNORMAL LOW (ref 10–30)

## 2020-06-26 LAB — SALICYLATE LEVEL: Salicylate Lvl: <7 mg/dL — ABNORMAL LOW (ref 7.0–30.0)

## 2020-06-26 MED ORDER — SODIUM CHLORIDE 0.9% FLUSH
3.0000 mL | Freq: Once | INTRAVENOUS | Status: AC
  Administered 2020-06-26: 3 mL via INTRAVENOUS

## 2020-06-26 NOTE — ED Triage Notes (Signed)
Pt states he and his wife were arguing. Pt is altered and is a poor historian of recent events. Pt is agitated when awake and falls asleep briefly and swiftly during triage. Pt's speech is slurred and frequently incoherent. Pt is uncoordinated and hyperactive. Pt in police custody.

## 2020-06-26 NOTE — ED Triage Notes (Signed)
Pt IVC'D by police. Pt made statements about suicide by cop and that he did nto want to be here any longer. Pt had firearm at the time these statements were made. Pt denies use of drugs and ETOH and denies making these statements to police.

## 2020-06-26 NOTE — ED Provider Notes (Signed)
[facility] Emergency Department Provider Note  ____________________________________________  Time seen: Approximately 11:39 PM  I have reviewed the triage vital signs and the nursing notes.   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  According to police, patient took an unknown amount of drugs and was holding a gun and threatening to shoot the police after having an argument with his wife. Patient made statements to the police about suicide by cop.  Patient has prior history of depression requiring psychiatric hospitalization.  Initially upon arrival to the emergency room patient was talkative and looked intoxicated.  At this time patient is not answering any questions, snoring, and hard to arouse.   Past Medical History:  Diagnosis Date  . Hypertension     Patient Active Problem List   Diagnosis Date Noted  . Tobacco use disorder 08/25/2015  . Opioid use with withdrawal (HCC) 08/25/2015  . 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 HTN (hypertension) 08/25/2015  . Dyslipidemia 08/25/2015  . Opioid use disorder, severe, dependence (HCC) 08/24/2015    Past Surgical History:  Procedure Laterality Date  . arm surgery Left     Prior to Admission medications   Medication Sig Start Date End Date Taking? Authorizing Provider  atorvastatin (LIPITOR) 20 MG tablet Take 1 tablet by mouth daily. 01/31/15 01/31/16  [provider]  carvedilol (COREG) 6.25 MG tablet Take 6.25 mg by mouth 2 (two) times daily with a meal.    [provider]  gabapentin (NEURONTIN) 300 MG capsule Take 900 mg by mouth 4 (four) times daily.     [provider]  lisinopril (PRINIVIL,ZESTRIL) 20 MG tablet Take 20 mg by mouth daily.    [provider]  naproxen (NAPROSYN) 500 MG tablet Take 1 tablet (500 mg total) by mouth 2 (two) times daily with a meal. 12/04/19   Tommi Rumps, PA-C  traZODone (DESYREL) 150 MG tablet Take 1 tablet (150 mg total) by mouth at bedtime as needed for sleep. 08/26/15   Jimmy Footman, MD    Allergies Sulfa antibiotics  History reviewed. No pertinent family history.  Social History Social History   Tobacco Use  . Smoking status: Current Every Day Smoker    Packs/day: 2.00  . Smokeless tobacco: Never Used  Substance Use Topics  . Alcohol use: No  . Drug use: Yes    Comment: percocet 10mg     Review of Systems  Constitutional: Negative for fever. + AMS Psych: + SI  Unable to obtain further due to intoxication and AMS  ____________________________________________   PHYSICAL EXAM:  VITAL SIGNS: ED Triage Vitals  Enc Vitals Group     BP 06/26/20 2117 (!) 150/91     Pulse Rate 06/26/20 2117 (!) 103     Resp 06/26/20 2117 (!) 22     Temp 06/26/20 2117 98.5 F (36.9 C)     Temp Source 06/26/20 2117 Oral     SpO2 06/26/20 2117 98 %     Weight 06/26/20 2129 210 lb (95.3 kg)     Height 06/26/20 2129 5\' 9"  (1.753 m)     Head Circumference --      Peak Flow --  Pain Score 06/26/20 2128 0     Pain Loc --      Pain Edu? --      Excl. in GC? --     Constitutional: Sleeping, snoring, hard to arouse, breathing and protecting airway HEENT:      Head: Normocephalic and atraumatic.         Eyes: Conjunctivae are normal. Sclera is non-icteric. Pinpoint pupils      Mouth/Throat: Mucous membranes are moist.       Neck: Supple with no signs of meningismus. Cardiovascular: Regular rate and rhythm. No murmurs, gallops, or rubs. Respiratory: Normal respiratory effort. Lungs are clear to auscultation bilaterally. Gastrointestinal: Soft and non  distended Musculoskeletal:  No edema, cyanosis, or erythema of extremities. Neurologic: Intoxicated, sleeping, face is symmetric, withdrawal from pain Skin: Skin is warm, dry and intact. No rash noted. Psychiatric: Suicidal  ____________________________________________   LABS (all labs ordered are listed, but only abnormal results are displayed)  Labs Reviewed  URINE DRUG SCREEN, QUALITATIVE ([facility] ONLY) - Abnormal; Notable for the following components:      Result Value   Opiate, Ur Screen POSITIVE (*)    Benzodiazepine, Ur Scrn POSITIVE (*)    All other components within normal limits  URINALYSIS, COMPLETE (UACMP) WITH MICROSCOPIC - Abnormal; Notable for the following components:   Color, Urine YELLOW (*)    APPearance CLEAR (*)    Hgb urine dipstick SMALL (*)    All other components within normal limits  SALICYLATE LEVEL - Abnormal; Notable for the following components:   Salicylate Lvl <7.0 (*)    All other components within normal limits  ACETAMINOPHEN LEVEL - Abnormal; Notable for the following components:   Acetaminophen (Tylenol), Serum <10 (*)    All other components within normal limits  BLOOD GAS, VENOUS - Abnormal; Notable for the following components:   pO2, Ven 68.0 (*)    Bicarbonate 28.7 (*)    Acid-Base Excess 2.1 (*)    All other components within normal limits  AMMONIA - Abnormal; Notable for the following components:   Ammonia 40 (*)    All other components within normal limits  COMPREHENSIVE METABOLIC PANEL  CBC  ETHANOL   ____________________________________________  EKG  ED ECG REPORT I, Nita Sickle, the attending physician, personally viewed and interpreted this ECG.  Normal sinus rhythm, rate of ninety-eight, normal intervals, normal axis, no ST elevations or depressions.  Normal EKG. ____________________________________________  RADIOLOGY  I have personally reviewed the images performed during this visit and I agree with the  Radiologist's read.   Interpretation by Radiologist:  CT Head Wo Contrast  Result Date: 06/27/2020 CLINICAL DATA:  Altered mental status are EXAM: CT HEAD WITHOUT CONTRAST TECHNIQUE: Contiguous axial images were obtained from the base of the skull through the vertex without intravenous contrast. COMPARISON:  CT 06/07/2009 FINDINGS: Brain: Linear hypoattenuation in the periphery of the temporal lobes compatible with beam hardening artifact. No evidence of acute infarction, hemorrhage, hydrocephalus, extra-axial collection or mass lesion/mass effect. Vascular: No hyperdense vessel or unexpected calcification. Skull: No calvarial fracture or suspicious osseous lesion. No scalp swelling or hematoma. Sinuses/Orbits: Paranasal sinuses predominantly clear. Chronic hypopneumatization and opacification of the right mastoid air cells. Left mastoid is more normally aerated. Middle ear cavities are clear. Included orbital structures are unremarkable. Other: None IMPRESSION: 1. No acute intracranial abnormality. 2. Chronic hypopneumatization and opacification of the right mastoid air cells. Electronically Signed   By: Kreg Shropshire M.D.   On: 06/27/2020 00:18  ____________________________________________   PROCEDURES  Procedure(s) performed:yes .1-3 Lead EKG Interpretation Performed by: Nita Sickle, MD Authorized by: Nita Sickle, MD     Interpretation: normal     ECG rate assessment: normal     Rhythm: sinus rhythm     Ectopy: none     Critical Care performed:  None ____________________________________________   INITIAL IMPRESSION / ASSESSMENT AND PLAN / ED COURSE  39 y.o. male the history of opiate abuse, depression, hypertension who presents under IVC by police for suicidal ideation. Patient with history of known drug abuse and was seen ingesting drugs by police officers upon arrival.  Patient has become more sedated since arriving in the hospital 2.5 hours ago.  At this time is  hard to arouse and snoring but maintaining his airway with gag reflex.  Pinpoint pupils but no signs of trauma.  Drug screen positive for benzos and opiates.  No significant electrolyte derangements, normal CBC and normal EKG with normal intervals.  At this point his presentation is most likely due to severe intoxication however will get a head CT to rule out intracranial abnormalities, get ammonia and VBG.  Will monitor patient very closely for need of intubation or Narcan in case his breathing deteriorates.  Old medical records reviewed.     _________________________ 4:15 AM on 06/27/2020 -----------------------------------------  Patient's mental status improving. More responsive but still severely intoxicated. Continues to maintain airway.  CT head visualized by me with no evidence of trauma, confirmed by radiology.  VBG with no signs of hypercapnia.  Borderline elevated ammonia with no history of cirrhosis.  We will continue to monitor closely.  Once patient is medically clear will consult psychiatry.  _________________________ 6:43 AM on 06/27/2020 -----------------------------------------  Patient now awakens to his name calling.  Is very confused and does not remember coming to the hospital or anything that happened last night.  He is now medically cleared awaiting for psychiatric evaluation.  The patient has been placed in psychiatric observation due to the need to provide a safe environment for the patient while obtaining psychiatric consultation and evaluation, as well as ongoing medical and medication management to treat the patient's condition.  The patient has been placed under full IVC at this time.   _____________________________________________ Please note:  Patient was evaluated in Emergency Department today for the symptoms described in the history of present illness. Patient was evaluated in the context of the global COVID-19 pandemic, which necessitated consideration that the  patient might be at risk for infection with the SARS-CoV-2 virus that causes COVID-19. Institutional protocols and algorithms that pertain to the evaluation of patients at risk for COVID-19 are in a state of rapid change based on information released by regulatory bodies including the CDC and federal and state organizations. These policies and algorithms were followed during the patient's care in the ED.  Some ED evaluations and interventions may be delayed as a result of limited staffing during the pandemic.   [place] Controlled Substance Database was reviewed by me. ____________________________________________   FINAL CLINICAL IMPRESSION(S) / ED DIAGNOSES   Final diagnoses:  Opiate abuse, continuous (HCC)  Benzodiazepine abuse (HCC)  Suicidal ideation      NEW MEDICATIONS STARTED DURING THIS VISIT:  ED Discharge Orders    None       Note:  This document was prepared using Dragon voice recognition software and may include unintentional dictation errors.    Don Perking, [place], MD 06/27/20 [phone]

## 2020-06-27 LAB — BLOOD GAS, VENOUS
Acid-Base Excess: 2.1 mmol/L — ABNORMAL HIGH (ref 0.0–2.0)
Bicarbonate: 28.7 mmol/L — ABNORMAL HIGH (ref 20.0–28.0)
O2 Saturation: 92.3 %
Patient temperature: 37
pCO2, Ven: 52 mmHg (ref 44.0–60.0)
pH, Ven: 7.35 (ref 7.250–7.430)
pO2, Ven: 68 mmHg — ABNORMAL HIGH (ref 32.0–45.0)

## 2020-06-27 LAB — AMMONIA: Ammonia: 40 umol/L — ABNORMAL HIGH (ref 9–35)

## 2020-06-27 MED ORDER — NALOXONE HCL 2 MG/2ML IJ SOSY
0.5000 mg | PREFILLED_SYRINGE | Freq: Once | INTRAMUSCULAR | Status: AC
  Administered 2020-06-27: 0.5 mg via INTRAVENOUS
  Filled 2020-06-27: qty 2

## 2020-06-27 MED ORDER — ONDANSETRON HCL 4 MG/2ML IJ SOLN
4.0000 mg | Freq: Once | INTRAMUSCULAR | Status: AC
  Administered 2020-06-27: 4 mg via INTRAVENOUS
  Filled 2020-06-27: qty 2

## 2020-06-27 NOTE — Progress Notes (Signed)
Patient ID: SHEP PORTER, male   DOB: 1980/12/25, 39 y.o.   MRN: [id number] [facility] Face-to-Face Psychiatry Consult   Reason for Consult:   Post intoxication IVC and threats to harm self  Referring Physician:  ED MD  Patient Identification: DIQUAN KASSIS MRN:  [id number] Principal Psychiatric Diagnosis: <principal problem not specified> Medical Diagnosis:  Active Problems:   * No active hospital problems. *  Polysubstance dependence  Post intoxication  Marital Discord   Total Time spent with patient: 25-35 min  SUBJECTIVE  Chief Complaint:  Wants to go home post intoxication contracts for safety   IZAIAS KRUPKA is a 39 y.o. male patient admitted with   Blood pressure 122/65, pulse 79, temperature (!) 97.5 F (36.4 C), temperature source Oral, resp. rate 18, height 5\' 9"  (1.753 m), weight 95.3 kg, SpO2 98 %.Body mass index is 31.01 kg/m.  HPI:  As above,  Opiate and ETOH intoxication, threatened to harm self in woods with officer.  Gun turned out to be toy gun.  Contracts for safety now.  Does not seek inpatient rehab  precipitated by marital chaos discord, custody matters, lack of 12 step programing   He says he is safe and seeks to go home today   Prior Inpatient Therapy:   None recently  Prior Outpatient Therapy Visits:  none recently   Previous Psychiatric Medications:   No current facility-administered medications for this encounter.   Current Outpatient Medications  Medication Sig Dispense Refill  . atorvastatin (LIPITOR) 20 MG tablet Take 20 mg by mouth at bedtime.     . carvedilol (COREG) 6.25 MG tablet Take 6.25 mg by mouth 2 (two) times daily with a meal.    . escitalopram (LEXAPRO) 20 MG tablet Take 20 mg by mouth daily.    lisinopril (PRINIVIL,ZESTRIL) 20 MG tablet Take 20 mg by mouth daily.    . Oxycodone HCl 10 MG TABS Take 10 mg by mouth every 6 (six) hours.    . pregabalin (LYRICA) 150 MG capsule Take 150 mg by mouth 3 (three) times daily.    .  traZODone (DESYREL) 50 MG tablet Take 50-100 mg by mouth at bedtime.      Past Psychiatric History:   Past Medical History:  Past Medical History:  Diagnosis Date  . Hypertension     Past Surgical History:  Procedure Laterality Date  . arm surgery Left     Allergies  Allergen Reactions  . Sulfa Antibiotics Shortness Of Breath and Anaphylaxis    OTHER REACTION    Family Medical and Psychiatric History:  History reviewed. No pertinent family history.  Social History:   Social History   Socioeconomic History  . Marital status: Married    Spouse name: Not on file  . Number of children: Not on file  . Years of education: Not on file  . Highest education level: Not on file  Occupational History  . Not on file  Tobacco Use  . Smoking status: Current Every Day Smoker    Packs/day: 2.00  . Smokeless tobacco: Never Used  Substance and Sexual Activity  . Alcohol use: No  . Drug use: Yes    Comment: percocet 10mg   . Sexual activity: Yes  Other Topics Concern  . Not on file  Social History Narrative  . Not on file   Social Determinants of Health   Financial Resource Strain:   . Difficulty of Paying Living Expenses:   Food Insecurity:   . Worried About  Running Out of Food in the Last Year:   . Ran Out of Food in the Last Year:   Transportation Needs:   . Lack of Transportation (Medical):   Marland Kitchen Lack of Transportation (Non-Medical):   Physical Activity:   . Days of Exercise per Week:   . Minutes of Exercise per Session:   Stress:   . Feeling of Stress :   Social Connections:   . Frequency of Communication with Friends and Family:   . Frequency of Social Gatherings with Friends and Family:   . Attends Religious Services:   . Active Member of Clubs or Organizations:   . Attends Banker Meetings:   Marland Kitchen Marital Status:       Educational History:    HS   Developmental History:  None significant  Court and Legal Issues --none now    Substance/Drug  History:   Social History   Substance and Sexual Activity  Alcohol Use No    Social History   Substance and Sexual Activity  Drug Use Yes   Comment: percocet 10mg    Mainly opioid dependence ETOH dependence, unclear if he has Hep C or elated issue   LABS  Results for orders placed or performed during the hospital encounter of 06/26/20 (from the past 48 hour(s))  Comprehensive metabolic panel     Status: None   Collection Time: 06/26/20  9:44 PM  Result Value Ref Range   Sodium 138 135 - 145 mmol/L   Potassium 3.5 3.5 - 5.1 mmol/L   Chloride 102 98 - 111 mmol/L   CO2 25 22 - 32 mmol/L   Glucose, Bld 97 70 - 99 mg/dL    Comment: Glucose reference range applies only to samples taken after fasting for at least 8 hours.   BUN 14 6 - 20 mg/dL   Creatinine, Ser 08/27/20 0.61 - 1.24 mg/dL   Calcium 8.9 8.9 - 4.96 mg/dL   Total Protein 7.6 6.5 - 8.1 g/dL   Albumin 4.6 3.5 - 5.0 g/dL   AST 24 15 - 41 U/L   ALT 19 0 - 44 U/L   Alkaline Phosphatase 98 38 - 126 U/L   Total Bilirubin 0.6 0.3 - 1.2 mg/dL   GFR calc non Af Amer >60 >60 mL/min   GFR calc Af Amer >60 >60 mL/min   Anion gap 11 5 - 15    Comment: Performed at [facility], [address] [place], [place]  CBC     Status: None   Collection Time: 06/26/20  9:44 PM  Result Value Ref Range   WBC 9.0 4.0 - 10.5 K/uL   RBC 4.48 4.22 - 5.81 MIL/uL   Hemoglobin 14.0 13.0 - 17.0 g/dL   HCT 08/27/20 39 - 52 %   MCV 88.8 80.0 - 100.0 fL   MCH 31.3 26.0 - 34.0 pg   MCHC 35.2 30.0 - 36.0 g/dL   RDW 66.5 99.3 - 57.0 %   Platelets 168 150 - 400 K/uL   nRBC 0.0 0.0 - 0.2 %    Comment: Performed at [facility], [address], [place], [place]  Urine Drug Screen, Qualitative ([facility] only)     Status: Abnormal   Collection Time: 06/26/20  9:44 PM  Result Value Ref Range   Tricyclic, Ur Screen NONE DETECTED NONE DETECTED   Amphetamines, Ur Screen NONE DETECTED NONE DETECTED   MDMA (Ecstasy)Ur Screen  NONE DETECTED NONE DETECTED   Cocaine Metabolite,Ur  [place] NONE DETECTED NONE DETECTED   Opiate, Ur Screen POSITIVE (A) NONE DETECTED   Phencyclidine (PCP) Ur S NONE DETECTED NONE DETECTED   Cannabinoid 50 Ng, Ur [place] NONE DETECTED NONE DETECTED   Barbiturates, Ur Screen NONE DETECTED NONE DETECTED   Benzodiazepine, Ur Scrn POSITIVE (A) NONE DETECTED   Methadone Scn, Ur NONE DETECTED NONE DETECTED    Comment: (NOTE) Tricyclics + metabolites, urine    Cutoff 1000 ng/mL Amphetamines + metabolites, urine  Cutoff 1000 ng/mL MDMA (Ecstasy), urine              Cutoff 500 ng/mL Cocaine Metabolite, urine          Cutoff 300 ng/mL Opiate + metabolites, urine        Cutoff 300 ng/mL Phencyclidine (PCP), urine         Cutoff 25 ng/mL Cannabinoid, urine                 Cutoff 50 ng/mL Barbiturates + metabolites, urine  Cutoff 200 ng/mL Benzodiazepine, urine              Cutoff 200 ng/mL Methadone, urine                   Cutoff 300 ng/mL  The urine drug screen provides only a preliminary, unconfirmed analytical test result and should not be used for non-medical purposes. Clinical consideration and professional judgment should be applied to any positive drug screen result due to possible interfering substances. A more specific alternate chemical method must be used in order to obtain a confirmed analytical result. Gas chromatography / mass spectrometry (GC/MS) is the preferred confirm atory method. Performed at [facility], [address] [place], [place] [address]   Ethanol     Status: None   Collection Time: 06/26/20  9:44 PM  Result Value Ref Range   Alcohol, Ethyl (B) <10 <10 mg/dL    Comment: (NOTE) Lowest detectable limit for serum alcohol is 10 mg/dL.  For medical purposes only. Performed at [facility], [address] [place], [place] [address]   Salicylate level     Status: Abnormal   Collection Time: 06/26/20  9:44 PM  Result Value Ref Range   Salicylate  Lvl <7.0 (L) 7.0 - 30.0 mg/dL    Comment: Performed at [facility], [address] [place], [place] [address]  Acetaminophen level     Status: Abnormal   Collection Time: 06/26/20  9:44 PM  Result Value Ref Range   Acetaminophen (Tylenol), Serum <10 (L) 10 - 30 ug/mL    Comment: (NOTE) Therapeutic concentrations vary significantly. A range of 10-30 ug/mL  may be an effective concentration for many patients. However, some  are best treated at concentrations outside of this range. Acetaminophen concentrations >150 ug/mL at 4 hours after ingestion  and >50 ug/mL at 12 hours after ingestion are often associated with  toxic reactions.  Performed at [facility], [address] [place], [place] [address]   Urinalysis, Complete w Microscopic     Status: Abnormal   Collection Time: 06/26/20  9:45 PM  Result Value Ref Range   Color, Urine YELLOW (A) YELLOW   APPearance CLEAR (A) CLEAR   Specific Gravity, Urine 1.020 1.005 - 1.030   pH 5.0 5.0 - 8.0   Glucose, UA NEGATIVE NEGATIVE mg/dL   Hgb urine dipstick SMALL (A) NEGATIVE   Bilirubin Urine NEGATIVE NEGATIVE   Ketones, ur NEGATIVE NEGATIVE mg/dL   Protein,  ur NEGATIVE NEGATIVE mg/dL   Nitrite NEGATIVE NEGATIVE   Leukocytes,Ua NEGATIVE NEGATIVE   RBC / HPF 0-5 0 - 5 RBC/hpf   WBC, UA 0-5 0 - 5 WBC/hpf   Bacteria, UA NONE SEEN NONE SEEN   Squamous Epithelial / LPF NONE SEEN 0 - 5   Mucus PRESENT     Comment: Performed at [facility], [address], [place], [place] [address]  Blood gas, venous     Status: Abnormal   Collection Time: 06/26/20 11:55 PM  Result Value Ref Range   pH, Ven 7.35 7.25 - 7.43   pCO2, Ven 52 44 - 60 mmHg   pO2, Ven 68.0 (H) 32 - 45 mmHg   Bicarbonate 28.7 (H) 20.0 - 28.0 mmol/L   Acid-Base Excess 2.1 (H) 0.0 - 2.0 mmol/L   O2 Saturation 92.3 %   Patient temperature 37.0    Collection site VEIN    Sample type VENOUS     Comment: Performed at [facility], [address] [place], [place] [address]  Ammonia     Status: Abnormal   Collection Time: 06/26/20 11:55 PM  Result Value Ref Range   Ammonia 40 (H) 9 - 35 umol/L    Comment: Performed at [facility], [address] [place], [place] [address]    PHYSICAL EXAM  Physical Exam  SYSTEMS REVIEW  Review of Systems  MENTAL STATUS  General Appearance:  Post intoxication, unkep   Rapport/Eye Contact:  Fair   Orientation:  Times four okay   Consciousness:  Not fluctuant or clouded  Concentration:   Fair  Mood/Affect:  Somewhat depressed and anxious    Language/Speech:  Normal   Rate:  Volume:  Fluency:  Articulation:  Thought Process:  No LOA FOI or related matters  Thought Content:   No SI HI or plans no psychosis or mania contracts for safety   SUICIDAL/HOMICIDAL  Risk to Self:   none now post intoxication Risk to Others:   none   Memory/Recall:  No new change   Immediate:  Recent:  Remote:  Fund of Knowledge/Intelligence/Cognition:  Average to below average   Judgement:  Poor  Insight:  Poor Reliability:  Poor   MOVEMENTS  Psychomotor Activity:  Normal  Handedness:    Strength & Muscle Tone: normal  Gait & Station:  Patient leans:   Akathisia: none   AIMS (if indicated):   none   Assets:  Not clear  Liabilities:  Chaos lack of sobriety poor social and coping skills   ADL'S:  Okay  Sleep:  Normal without drugs   Appetite:  Normal for now   Formulation:    Treatment Plan Summary:  Asks to go home  IVC rescinded  He contracts for safety   Disposition:  Home    Roselind Messier, MD 06/27/2020 10:58 AM

## 2020-06-27 NOTE — ED Notes (Signed)
Pt verbalized understanding of discharge instructions and need for follow up care. Pt is NAD at this time. Signature pad not working. Hard copy of discharge agreement printed and signed by pt and this RN.

## 2020-06-27 NOTE — ED Notes (Signed)
Pt placed on cardiac monitor, fall alarm (with fall risk band) and oxygen monitoring. Pt snoring loudly and arousable to painful stimuli & found to be 84-85% on RA. Pt placed on [facility] and EDP notified. Pt now maintaining an oxygen saturation of >95%. Pupils pin point grade 2 and re-active to light (brisk). Skin warm and dry with all extremity pulses +3.

## 2020-06-27 NOTE — ED Notes (Signed)
Pt continues to stand in doorway and has removed all monitor cords/devices. Pt states he is not staying and pt informed that he is IVC'd and can't leave. Pt told he must be evaluated by psychiatry to ensure that he is not a harm to himself or others.

## 2020-06-27 NOTE — ED Notes (Signed)
Pt taken to

## 2020-06-27 NOTE — ED Provider Notes (Signed)
-----------------------------------------   10:28 AM on 06/27/2020 -----------------------------------------  Patient has been seen and evaluated by psychiatry.  They believe the patient is safe for discharge home from a psychiatric standpoint.  They have rescinded the patient's IVC.  Medical work-up is largely nonrevealing besides his urine toxicology.  We will discharge.  Patient is awake alert, no distress.   Minna Antis, MD 06/27/20 1029

## 2020-06-27 NOTE — ED Notes (Signed)
Pt awake and stating that he is "mad about being here". Pt disconnected from monitor to use toilet and was steady when ambulating.

## 2020-06-27 NOTE — BH Assessment (Signed)
Assessment Note  Nicholas Delgado is an 39 y.o. male who presents to the ER via law enforcement. Per the IVC, the patient had a gun in the woods, voicing SI. Patient was placed under IVC by law enforcement.  When law enforcement arrived, he was brought to the ER due to the SI and for not being coherent and alert. It was suspected drug use or an overdose.  Per the report of the patient, he and his wife are currently separated and have had ongoing arguments. He took his son in the woods to [employer] and she knew this, he states it was toy gun. He further reports, she called law enforcement and made it appear he was trying to harm his self.  Patient asked writer to contact his mother instead of his wife, for collateral information. Writer spoke with the patients mother (Nicholas [phone]). She confirmed the ongoing conflict between the patient and his estranged wife. She also reports of having no concerns of the patient harming himself or anyone else.  During the interview, the patient was initially lethargic and difficult to engage. However, when he fully awoke, he was alert and able to engage in the conversation without any problems. He voiced his frustration about being in the ER but wasnt rude. Throughout the interview, the patient denied SI/HI and AV/H. Without writer asking the patient, he was able to provide protective factors and reason why he wouldnt end his life. Patient denies the use of mind-altering substances but his UDS was positive for opiates.  Diagnosis: Substance Use Disorder  Past Medical History:  Past Medical History:  Diagnosis Date   Hypertension     Past Surgical History:  Procedure Laterality Date   arm surgery Left     Family History: History reviewed. No pertinent family history.  Social History:  reports that he has been smoking. He has been smoking about 2.00 packs per day. He has never used smokeless tobacco. He reports current drug use. He reports that  he does not drink alcohol.  Additional Social History:  Alcohol / Drug Use Pain Medications: See PTA Prescriptions: See PTA Over the Counter: See PTA History of alcohol / drug use?: No history of alcohol / drug abuse Longest period of sobriety (when/how long): n/a- Per patient  CIWA: CIWA-Ar BP: 122/65 Pulse Rate: 79 COWS:    Allergies:  Allergies  Allergen Reactions   Sulfa Antibiotics Shortness Of Breath and Anaphylaxis    OTHER REACTION    Home Medications: (Not in a hospital admission)   OB/GYN Status:  No LMP for male patient.  General Assessment Data Location of Assessment: [facility] ED TTS Assessment: In system Is this a Tele or Face-to-Face Assessment?: Face-to-Face Is this an Initial Assessment or a Re-assessment for this encounter?: Initial Assessment Patient Accompanied by:: N/A Mudlogger) Language Other than English: No Living Arrangements: Other (Comment) (Private Home) What gender do you identify as?: Male Date Telepsych consult ordered in [facility]: 06/27/20 Time Telepsych consult ordered in [facility]: 0643 Marital status: Separated Pregnancy Status: No Living Arrangements: Other (Comment), Other relatives Can pt return to current living arrangement?: Yes Admission Status: Involuntary Petitioner: Police Is patient capable of signing voluntary admission?: No (Under IVC) Referral Source: Other Insurance type: [place] Medicaid  Medical Screening Exam [facility] Walk-in ONLY) Medical Exam completed: Yes  Crisis Care Plan Living Arrangements: Other (Comment), Other relatives Legal Guardian: Other: (Self) Name of Psychiatrist: Reports of none Name of Therapist: Reports of none  Education Status Is patient currently  in school?: No Is the patient employed, unemployed or receiving disability?: Employed, Unemployed  Risk to self with the past 6 months Suicidal Ideation: No Has patient been a risk to self within the past 6 months prior to admission? : No Suicidal  Intent: No Has patient had any suicidal intent within the past 6 months prior to admission? : No Is patient at risk for suicide?: No Suicidal Plan?: No Has patient had any suicidal plan within the past 6 months prior to admission? : No Access to Means: No What has been your use of drugs/alcohol within the last 12 months?: Reports of none Previous Attempts/Gestures: No How many times?: 0 Other Self Harm Risks: Reports of none Triggers for Past Attempts: None known Intentional Self Injurious Behavior: None Family Suicide History: Unknown Recent stressful life event(s): Divorce, Conflict (Comment), Other (Comment) Persecutory voices/beliefs?: No Depression: No Depression Symptoms: Feeling angry/irritable Substance abuse history and/or treatment for substance abuse?: No Suicide prevention information given to non-admitted patients: Not applicable  Risk to Others within the past 6 months Homicidal Ideation: No Does patient have any lifetime risk of violence toward others beyond the six months prior to admission? : No Thoughts of Harm to Others: No Current Homicidal Intent: No Current Homicidal Plan: No Access to Homicidal Means: No Identified Victim: Reports of none History of harm to others?: No Assessment of Violence: None Noted Violent Behavior Description: Reports of none Does patient have access to weapons?: Yes (Comment) Criminal Charges Pending?: Yes Describe Pending Criminal Charges: Attempted Larceny & DWLR Does patient have a court date: Yes Court Date: 07/17/20 (07/28/2020) Is patient on probation?: No  Psychosis Hallucinations: None noted Delusions: None noted  Mental Status Report Appearance/Hygiene: Unremarkable, In scrubs Eye Contact: Good Motor Activity: Freedom of movement, Unremarkable Speech: Logical/coherent, Unremarkable Level of Consciousness: Alert Mood: Pleasant, Anxious Affect: Appropriate to circumstance Anxiety Level: Minimal Thought Processes:  Coherent, Relevant Judgement: Unimpaired Orientation: Person, Place, Time, Situation, Appropriate for developmental age Obsessive Compulsive Thoughts/Behaviors: Minimal  Cognitive Functioning Concentration: Decreased Memory: Remote Intact, Recent Impaired Is patient IDD: No Insight: Fair Impulse Control: Fair Appetite: Good Have you had any weight changes? : No Change Sleep: No Change Total Hours of Sleep: 7 Vegetative Symptoms: None  ADLScreening [facility]) Patient's cognitive ability adequate to safely complete daily activities?: Yes Patient able to express need for assistance with ADLs?: Yes Independently performs ADLs?: Yes (appropriate for developmental age)  Prior Inpatient Therapy Prior Inpatient Therapy: No  Prior Outpatient Therapy Prior Outpatient Therapy: No Does patient have an ACCT team?: No Does patient have Intensive In-House Services?  : No Does patient have [facility] services? : No Does patient have P4CC services?: No  ADL Screening (condition at time of admission) Patient's cognitive ability adequate to safely complete daily activities?: Yes Is the patient deaf or have difficulty hearing?: No Does the patient have difficulty seeing, even when wearing glasses/contacts?: No Does the patient have difficulty concentrating, remembering, or making decisions?: No Patient able to express need for assistance with ADLs?: Yes Does the patient have difficulty dressing or bathing?: No Independently performs ADLs?: Yes (appropriate for developmental age) Does the patient have difficulty walking or climbing stairs?: No Weakness of Legs: None Weakness of Arms/Hands: None  Home Assistive Devices/Equipment Home Assistive Devices/Equipment: None  Therapy Consults (therapy consults require a physician order) PT Evaluation Needed: No OT Evalulation Needed: No SLP Evaluation Needed: No Abuse/Neglect Assessment (Assessment to be complete while patient is  alone) Abuse/Neglect Assessment Can Be Completed:  Yes Physical Abuse: Denies Verbal Abuse: Denies Sexual Abuse: Denies Exploitation of patient/patient's resources: Denies Self-Neglect: Denies Values / Beliefs Cultural Requests During Hospitalization: None Spiritual Requests During Hospitalization: None Consults Spiritual Care Consult Needed: No Transition of Care Team Consult Needed: No Advance Directives (For Healthcare) Does Patient Have a Medical Advance Directive?: No   Disposition:  Disposition Initial Assessment Completed for this Encounter: Yes  On Site Evaluation by:   Reviewed with Physician:    Lilyan Gilford MS, LCAS, [facility], NCC Therapeutic Triage Specialist 06/27/2020 5:06 PM,

## 2020-06-27 NOTE — BH Assessment (Signed)
Writer called and left a HIPPA Compliant message with mother ([phone]), requesting a return phone call. Writer need collateral information to assist with disposition.

## 2020-10-15 ENCOUNTER — Emergency Department: Payer: Medicare Other

## 2020-10-15 ENCOUNTER — Emergency Department
Admission: EM | Admit: 2020-10-15 | Discharge: 2020-10-15 | Disposition: A | Discharge status: Home / Self Care | Payer: Medicare Other | Attending: Emergency Medicine | Admitting: Emergency Medicine

## 2020-10-15 ENCOUNTER — Other Ambulatory Visit: Payer: Self-pay

## 2020-10-15 DIAGNOSIS — Z79899 Other long term (current) drug therapy: Secondary | ICD-10-CM | POA: Insufficient documentation

## 2020-10-15 DIAGNOSIS — I10 Essential (primary) hypertension: Secondary | ICD-10-CM | POA: Diagnosis not present

## 2020-10-15 DIAGNOSIS — R569 Unspecified convulsions: Secondary | ICD-10-CM | POA: Diagnosis present

## 2020-10-15 DIAGNOSIS — F17200 Nicotine dependence, unspecified, uncomplicated: Secondary | ICD-10-CM | POA: Insufficient documentation

## 2020-10-15 LAB — CBC WITH DIFFERENTIAL/PLATELET
Abs Immature Granulocytes: 0.02 10*3/uL (ref 0.00–0.07)
Basophils Absolute: 0 10*3/uL (ref 0.0–0.1)
Basophils Relative: 0 %
Eosinophils Absolute: 0 10*3/uL (ref 0.0–0.5)
Eosinophils Relative: 1 %
HCT: 43 % (ref 39.0–52.0)
Hemoglobin: 14.8 g/dL (ref 13.0–17.0)
Immature Granulocytes: 0 %
Lymphocytes Relative: 30 %
Lymphs Abs: 2.1 10*3/uL (ref 0.7–4.0)
MCH: 30.1 pg (ref 26.0–34.0)
MCHC: 34.4 g/dL (ref 30.0–36.0)
MCV: 87.6 fL (ref 80.0–100.0)
Monocytes Absolute: 0.3 10*3/uL (ref 0.1–1.0)
Monocytes Relative: 4 %
Neutro Abs: 4.6 10*3/uL (ref 1.7–7.7)
Neutrophils Relative %: 65 %
Platelets: 245 10*3/uL (ref 150–400)
RBC: 4.91 MIL/uL (ref 4.22–5.81)
RDW: 13.7 % (ref 11.5–15.5)
WBC: 7.1 10*3/uL (ref 4.0–10.5)
nRBC: 0 % (ref 0.0–0.2)

## 2020-10-15 LAB — URINE DRUG SCREEN, QUALITATIVE (ARMC ONLY)
Amphetamines, Ur Screen: NOT DETECTED
Barbiturates, Ur Screen: NOT DETECTED
Benzodiazepine, Ur Scrn: NOT DETECTED
Cannabinoid 50 Ng, Ur ~~LOC~~: NOT DETECTED
Cocaine Metabolite,Ur ~~LOC~~: NOT DETECTED
MDMA (Ecstasy)Ur Screen: NOT DETECTED
Methadone Scn, Ur: NOT DETECTED
Opiate, Ur Screen: NOT DETECTED
Phencyclidine (PCP) Ur S: NOT DETECTED
Tricyclic, Ur Screen: NOT DETECTED

## 2020-10-15 LAB — COMPREHENSIVE METABOLIC PANEL
ALT: 19 U/L (ref 0–44)
AST: 27 U/L (ref 15–41)
Albumin: 4.4 g/dL (ref 3.5–5.0)
Alkaline Phosphatase: 85 U/L (ref 38–126)
Anion gap: 14 (ref 5–15)
BUN: 13 mg/dL (ref 6–20)
CO2: 21 mmol/L — ABNORMAL LOW (ref 22–32)
Calcium: 8.9 mg/dL (ref 8.9–10.3)
Chloride: 103 mmol/L (ref 98–111)
Creatinine, Ser: 0.93 mg/dL (ref 0.61–1.24)
GFR, Estimated: >60 mL/min (ref >60)
Glucose, Bld: 201 mg/dL — ABNORMAL HIGH (ref 70–99)
Potassium: 3.7 mmol/L (ref 3.5–5.1)
Sodium: 138 mmol/L (ref 135–145)
Total Bilirubin: 0.5 mg/dL (ref 0.3–1.2)
Total Protein: 7.7 g/dL (ref 6.5–8.1)

## 2020-10-15 LAB — URINALYSIS, COMPLETE (UACMP) WITH MICROSCOPIC
Bacteria, UA: NONE SEEN
Bilirubin Urine: NEGATIVE
Glucose, UA: NEGATIVE mg/dL
Ketones, ur: 5 mg/dL — AB
Leukocytes,Ua: NEGATIVE
Nitrite: NEGATIVE
Protein, ur: 100 mg/dL — AB
Specific Gravity, Urine: 1.023 (ref 1.005–1.030)
pH: 5 (ref 5.0–8.0)

## 2020-10-15 LAB — ETHANOL: Alcohol, Ethyl (B): <10 mg/dL (ref <10)

## 2020-10-15 MED ORDER — LACTATED RINGERS IV BOLUS
1000.0000 mL | Freq: Once | INTRAVENOUS | Status: AC
  Administered 2020-10-15: 1000 mL via INTRAVENOUS

## 2020-10-15 MED ORDER — LORAZEPAM 2 MG/ML IJ SOLN
1.0000 mg | Freq: Once | INTRAMUSCULAR | Status: AC
  Administered 2020-10-15: 1 mg via INTRAVENOUS
  Filled 2020-10-15: qty 1

## 2020-10-15 NOTE — ED Triage Notes (Addendum)
Seizure witnessed by wife while sitting in car. Felt shaky when trying to explain to wife how he was feeling he began having seizure for around 3-5 mins. Ems reports seizure activity had stopped prior to their arrival. Post ictal, combative initially. Bit tongue during seizure. A&o x 4 on arrival but still does not remember events of seizure recent dx of liver failure around 3 weeks ago. MD present to assess on arrival.

## 2020-10-15 NOTE — ED Provider Notes (Signed)
[facility] Emergency Department Provider Note   ____________________________________________   First MD Initiated Contact with Patient 10/15/20 1618     (approximate)  I have reviewed the triage vital signs and the nursing notes.   HISTORY  Chief Complaint Seizures    HPI Nicholas Delgado is a 39 y.o. male with past medical history of hypertension and opiate abuse who presents to the ED following seizure.  Per EMS, patient had driven up to talk to his wife while she was at work.  He then had a generalized tonic-clonic seizure lasting 3 to 5 minutes that was witnessed by his wife.  He did not hit fall or hit his head, seizure activity had ceased by the time EMS arrived, but he was disoriented and postictal during transportation.  On arrival to the ED, patient is awake and alert, denies any complaints other than tongue pain.  He believes he bit his tongue but did not urinate on himself.  He denies any headache, vision changes, numbness, or weakness.  He restarted his blood pressure medications yesterday, but does not take any other medications, denies alcohol or drug abuse recently.  Wife reported patient diagnosed with liver failure 3 weeks ago, but patient denies this.  He denies any history of seizures.        Past Medical History:  Diagnosis Date  . Hypertension     Patient Active Problem List   Diagnosis Date Noted  . Tobacco use disorder 08/25/2015  . Opioid use with withdrawal (HCC) 08/25/2015  . 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 HTN (hypertension) 08/25/2015  . Dyslipidemia 08/25/2015  . Opioid use disorder, severe, dependence (HCC) 08/24/2015    Past Surgical History:  Procedure Laterality Date  . arm surgery Left     Prior to Admission medications   Medication Sig Start Date End Date Taking? Authorizing Provider  atorvastatin (LIPITOR) 20 MG tablet Take 20 mg by mouth at bedtime.      [provider]  carvedilol (COREG) 6.25 MG tablet Take 6.25 mg by mouth 2 (two) times daily with a meal.    [provider]  escitalopram (LEXAPRO) 20 MG tablet Take 20 mg by mouth daily. 06/03/20   [provider]  lisinopril (PRINIVIL,ZESTRIL) 20 MG tablet Take 20 mg by mouth daily.    [provider]  Oxycodone HCl 10 MG TABS Take 10 mg by mouth every 6 (six) hours. 06/25/20   [provider]  pregabalin (LYRICA) 150 MG capsule Take 150 mg by mouth 3 (three) times daily. 06/18/20   [provider]  traZODone (DESYREL) 50 MG tablet Take 50-100 mg by mouth at bedtime. 06/03/20   [provider]    Allergies Sulfa antibiotics  History reviewed. No pertinent family history.  Social History Social History   Tobacco Use  . Smoking status: Current Every Day Smoker    Packs/day: 2.00  . Smokeless tobacco: Never Used  Substance Use Topics  . Alcohol use: No  . Drug use: Yes    Comment: percocet 10mg     Review of Systems  Constitutional: No fever/chills Eyes: No visual changes. ENT: No sore throat.  Positive for tongue pain. Cardiovascular: Denies chest pain. Respiratory: Denies shortness of breath. Gastrointestinal: No abdominal pain.  No nausea, no vomiting.  No diarrhea.  No constipation. Genitourinary: Negative for dysuria. Musculoskeletal: Negative for back pain. Skin: Negative for rash. Neurological: Negative for headaches, focal weakness or numbness.  Positive  for seizure.  ____________________________________________   PHYSICAL EXAM:  VITAL SIGNS: ED Triage Vitals  Enc Vitals Group     BP      Pulse      Resp      Temp      Temp src      SpO2      Weight      Height      Head Circumference      Peak Flow      Pain Score      Pain Loc      Pain Edu?      Excl. in GC?     Constitutional: Alert and oriented. Eyes: Conjunctivae are normal.  Pupils equal round reactive to light bilaterally. Head:  Atraumatic. Nose: No congestion/rhinnorhea. Mouth/Throat: Mucous membranes are moist.  Superficial laceration to right lateral tongue. Neck: Normal ROM Cardiovascular: Tachycardic, regular rhythm. Grossly normal heart sounds. Respiratory: Normal respiratory effort.  No retractions. Lungs CTAB. Gastrointestinal: Soft and nontender. No distention. Genitourinary: deferred Musculoskeletal: No lower extremity tenderness nor edema. Neurologic:  Normal speech and language. No gross focal neurologic deficits are appreciated. Skin:  Skin is warm, dry and intact. No rash noted. Psychiatric: Mood and affect are normal. Speech and behavior are normal.  ____________________________________________   LABS (all labs ordered are listed, but only abnormal results are displayed)  Labs Reviewed  COMPREHENSIVE METABOLIC PANEL - Abnormal; Notable for the following components:      Result Value   CO2 21 (*)    Glucose, Bld 201 (*)    All other components within normal limits  URINALYSIS, COMPLETE (UACMP) WITH MICROSCOPIC - Abnormal; Notable for the following components:   Color, Urine YELLOW (*)    APPearance CLEAR (*)    Hgb urine dipstick SMALL (*)    Ketones, ur 5 (*)    Protein, ur 100 (*)    All other components within normal limits  CBC WITH DIFFERENTIAL/PLATELET  ETHANOL  URINE DRUG SCREEN, QUALITATIVE ([facility] ONLY)   ____________________________________________  EKG  ED ECG REPORT I, Chesley Noon, the attending physician, personally viewed and interpreted this ECG.   Date: 10/15/2020  EKG Time: 16:39  Rate: 111  Rhythm: sinus tachycardia  Axis: Normal  Intervals:none  ST&T Change: None   PROCEDURES  Procedure(s) performed (including Critical Care):  Procedures   ____________________________________________   INITIAL IMPRESSION / ASSESSMENT AND PLAN / ED COURSE       39 year old male with past medical history of hypertension who presents to the ED following witnessed  generalized tonic-clonic seizure activity by his wife.  Presentation does appear consistent with seizure given his tongue laceration and postictal state reported by EMS.  He has no history of seizures, also denies recent alcohol or drug abuse.  We will check CT head, labs including LFTs, and EKG.  Patient to be hydrated with IV fluids and given dose of IV Ativan to guard against recurrent seizure.  Patient now admits that he has been abusing Lyrica recreationally and had taken "a handful" of 150 mg Lyrica earlier today sometime before noon.  He is concerned this could have contributed to his seizure episode and he is also concerned about liver disease.  LFTs are unremarkable and I doubt hepatic encephalopathy.  Case discussed with poison control, who agrees that Lyrica would be very unlikely to contribute to a seizure.  Patient denies any suicidal homicidal ideation, also adamantly denies taking any other medications excessively.  CT head is pending at this time  and we will continue to observe patient for any recurrent seizure activity.  CT head is negative for acute process, imaging reviewed by me and shows no obvious hemorrhage.  Patient remains awake and alert with no complaints other than tongue pain at this time.  He was advised to cut back on his Lyrica intake, again denies taking any other medications or drinking alcohol.  He has remained seizure-free for greater than 2 hours of observation is appropriate for discharge home with neurology follow-up.  He was counseled to return to the ED for any recurrent episodes, patient agrees with plan.      ____________________________________________   FINAL CLINICAL IMPRESSION(S) / ED DIAGNOSES  Final diagnoses:  Seizure [facility])     ED Discharge Orders    None       Note:  This document was prepared using Dragon voice recognition software and may include unintentional dictation errors.   Chesley Noon, MD 10/15/20 [phone]

## 2020-11-24 ENCOUNTER — Observation Stay
Admission: EM | Admit: 2020-11-24 | Discharge: 2020-11-25 | Disposition: A | Discharge status: Home / Self Care | Payer: Medicare Other | Attending: Internal Medicine | Admitting: Internal Medicine

## 2020-11-24 ENCOUNTER — Other Ambulatory Visit: Payer: Self-pay

## 2020-11-24 ENCOUNTER — Observation Stay: Payer: Medicare Other

## 2020-11-24 DIAGNOSIS — Z20822 Contact with and (suspected) exposure to covid-19: Secondary | ICD-10-CM | POA: Insufficient documentation

## 2020-11-24 DIAGNOSIS — F17200 Nicotine dependence, unspecified, uncomplicated: Secondary | ICD-10-CM | POA: Insufficient documentation

## 2020-11-24 DIAGNOSIS — R569 Unspecified convulsions: Principal | ICD-10-CM

## 2020-11-24 DIAGNOSIS — Z79899 Other long term (current) drug therapy: Secondary | ICD-10-CM | POA: Diagnosis not present

## 2020-11-24 LAB — CBC WITH DIFFERENTIAL/PLATELET
Abs Immature Granulocytes: 0.03 10*3/uL (ref 0.00–0.07)
Basophils Absolute: 0 10*3/uL (ref 0.0–0.1)
Basophils Relative: 0 %
Eosinophils Absolute: 0.1 10*3/uL (ref 0.0–0.5)
Eosinophils Relative: 2 %
HCT: 34.4 % — ABNORMAL LOW (ref 39.0–52.0)
Hemoglobin: 11.8 g/dL — ABNORMAL LOW (ref 13.0–17.0)
Immature Granulocytes: 0 %
Lymphocytes Relative: 28 %
Lymphs Abs: 2 10*3/uL (ref 0.7–4.0)
MCH: 30.6 pg (ref 26.0–34.0)
MCHC: 34.3 g/dL (ref 30.0–36.0)
MCV: 89.4 fL (ref 80.0–100.0)
Monocytes Absolute: 0.8 10*3/uL (ref 0.1–1.0)
Monocytes Relative: 11 %
Neutro Abs: 4.2 10*3/uL (ref 1.7–7.7)
Neutrophils Relative %: 59 %
Platelets: 161 10*3/uL (ref 150–400)
RBC: 3.85 MIL/uL — ABNORMAL LOW (ref 4.22–5.81)
RDW: 13.6 % (ref 11.5–15.5)
WBC: 7.1 10*3/uL (ref 4.0–10.5)
nRBC: 0 % (ref 0.0–0.2)

## 2020-11-24 LAB — URINALYSIS, COMPLETE (UACMP) WITH MICROSCOPIC
Bacteria, UA: NONE SEEN
Bilirubin Urine: NEGATIVE
Glucose, UA: NEGATIVE mg/dL
Ketones, ur: NEGATIVE mg/dL
Leukocytes,Ua: NEGATIVE
Nitrite: NEGATIVE
Protein, ur: NEGATIVE mg/dL
Specific Gravity, Urine: 1.021 (ref 1.005–1.030)
Squamous Epithelial / HPF: NONE SEEN (ref 0–5)
pH: 5 (ref 5.0–8.0)

## 2020-11-24 LAB — RESP PANEL BY RT-PCR (FLU A&B, COVID) ARPGX2
Influenza A by PCR: NEGATIVE
Influenza B by PCR: NEGATIVE
SARS Coronavirus 2 by RT PCR: NEGATIVE

## 2020-11-24 LAB — BASIC METABOLIC PANEL
Anion gap: 9 (ref 5–15)
BUN: 13 mg/dL (ref 6–20)
CO2: 26 mmol/L (ref 22–32)
Calcium: 8.4 mg/dL — ABNORMAL LOW (ref 8.9–10.3)
Chloride: 101 mmol/L (ref 98–111)
Creatinine, Ser: 0.76 mg/dL (ref 0.61–1.24)
GFR, Estimated: >60 mL/min (ref >60)
Glucose, Bld: 90 mg/dL (ref 70–99)
Potassium: 3.7 mmol/L (ref 3.5–5.1)
Sodium: 136 mmol/L (ref 135–145)

## 2020-11-24 LAB — URINE DRUG SCREEN, QUALITATIVE (ARMC ONLY)
Amphetamines, Ur Screen: NOT DETECTED
Barbiturates, Ur Screen: NOT DETECTED
Benzodiazepine, Ur Scrn: NOT DETECTED
Cannabinoid 50 Ng, Ur ~~LOC~~: NOT DETECTED
Cocaine Metabolite,Ur ~~LOC~~: NOT DETECTED
MDMA (Ecstasy)Ur Screen: NOT DETECTED
Methadone Scn, Ur: NOT DETECTED
Opiate, Ur Screen: NOT DETECTED
Phencyclidine (PCP) Ur S: NOT DETECTED
Tricyclic, Ur Screen: POSITIVE — AB

## 2020-11-24 LAB — ETHANOL: Alcohol, Ethyl (B): <10 mg/dL (ref <10)

## 2020-11-24 MED ORDER — KETOROLAC TROMETHAMINE 30 MG/ML IJ SOLN
15.0000 mg | INTRAMUSCULAR | Status: AC
  Administered 2020-11-24: 15 mg via INTRAVENOUS
  Filled 2020-11-24: qty 1

## 2020-11-24 MED ORDER — SODIUM CHLORIDE 0.9 % IV BOLUS
1000.0000 mL | Freq: Once | INTRAVENOUS | Status: AC
  Administered 2020-11-24: 1000 mL via INTRAVENOUS

## 2020-11-24 MED ORDER — LEVETIRACETAM IN NACL 1000 MG/100ML IV SOLN
1000.0000 mg | Freq: Two times a day (BID) | INTRAVENOUS | Status: DC
  Administered 2020-11-24: 1000 mg via INTRAVENOUS
  Filled 2020-11-24: qty 100

## 2020-11-24 MED ORDER — LORAZEPAM 2 MG/ML IJ SOLN
2.0000 mg | Freq: Once | INTRAMUSCULAR | Status: AC
  Administered 2020-11-24: 1 mg via INTRAVENOUS
  Filled 2020-11-24: qty 1

## 2020-11-24 MED ORDER — NICOTINE 21 MG/24HR TD PT24
21.0000 mg | MEDICATED_PATCH | Freq: Every day | TRANSDERMAL | Status: DC | PRN
  Administered 2020-11-24: 21 mg via TRANSDERMAL
  Filled 2020-11-24: qty 1

## 2020-11-24 NOTE — H&P (Signed)
History and Physical    Nicholas Delgado UKG:[id number] DOB: April 15, 1981 DOA: 11/24/2020  I have briefly reviewed the patient's prior medical records in [facility] Link  PCP: Nicholas Pitter, MD  Patient coming from: home  Chief Complaint: Seizure  HPI: Nicholas Delgado is a 39 y.o. male with medical history significant of hypertension, chronic pain due to psoriatic arthritis, prior history of drug use comes to the hospital with complaints of seizure.  Patient reports having seizures as a kid, but for some reason about a month and a half ago he started having seizures again.  He saw neurology as an outpatient at the end of October and was supposed to get routine EEG, he was not placed on AEDs.  Patient comes to the hospital today for having 3 generalized tonic-clonic seizures in the last 24 hours.  Wife is at bedside and tells me that over the last couple of weeks he is probably had around 10 seizures.  Today the longest one lasted several minutes, wife reports that patient was very confused once the seizure was done and did not know where he is and could not recognize people around him.  Confusion slowly improved for 10 to 15 minutes.  Denies any urinary incontinence during seizure but reports tongue pain and patient believes he has bitten his tongue.  Other than that he is feeling at his normal self, denies any fever or chills, no recent significant change in his medications, no chest pain, no shortness of breath, no abdominal pain, no nausea or vomiting.  ED Course: In the ED patient is afebrile 98.4, normotensive and satting well on room air.  Blood work is fairly unremarkable, has normal renal function and CBC shows mild anemia but otherwise no acute findings.  Neurology was not consulted because there is no one available overnight, we were asked to admit.  Review of Systems: All systems reviewed, and apart from HPI, all negative  Past Medical History:  Diagnosis Date  . Hypertension     Past  Surgical History:  Procedure Laterality Date  . arm surgery Left      reports that he has been smoking. He has been smoking about 2.00 packs per day. He has never used smokeless tobacco. He reports current drug use. He reports that he does not drink alcohol.  Allergies  Allergen Reactions  . Sulfa Antibiotics Shortness Of Breath and Anaphylaxis    OTHER REACTION    Family history reviewed, noncontributory  Prior to Admission medications   Medication Sig Start Date End Date Taking? Authorizing Provider  atorvastatin (LIPITOR) 20 MG tablet Take 20 mg by mouth at bedtime.     [provider]  carvedilol (COREG) 6.25 MG tablet Take 6.25 mg by mouth 2 (two) times daily with a meal.    [provider]  escitalopram (LEXAPRO) 20 MG tablet Take 20 mg by mouth daily. 06/03/20   [provider]  lisinopril (PRINIVIL,ZESTRIL) 20 MG tablet Take 20 mg by mouth daily.    [provider]  Oxycodone HCl 10 MG TABS Take 10 mg by mouth every 6 (six) hours. 06/25/20   [provider]  pregabalin (LYRICA) 150 MG capsule Take 150 mg by mouth 3 (three) times daily. 06/18/20   [provider]  traZODone (DESYREL) 50 MG tablet Take 50-100 mg by mouth at bedtime. 06/03/20   [provider]    Physical Exam: Vitals:   11/24/20 1909 11/24/20 2000 11/24/20 2017 11/24/20 2126  BP:  Marland Kitchen)  136/95 (!) 136/95 139/90  Pulse:  98 98 96  Resp:   18 19  Temp:      TempSrc:      SpO2:  98% 98% 96%  Weight: 104.3 kg     Height: 6' (1.829 m)       Constitutional: NAD, calm, comfortable Eyes: PERRL, lids and conjunctivae normal ENMT: Mucous membranes are moist. Neck: normal, supple Respiratory: clear to auscultation bilaterally, no wheezing, no crackles. Cardiovascular: Regular rate and rhythm, no murmurs / rubs / gallops. No extremity edema.  Abdomen: no tenderness, no masses palpated. Bowel sounds positive.  Musculoskeletal: no clubbing / cyanosis.  Normal muscle tone.  Skin: no rashes, lesions, ulcers. No induration Neurologic: CN 2-12 grossly intact. Strength 5/5 in all 4.  Psychiatric: Normal judgment and insight. Alert and oriented x 3.  Labs on Admission: I have personally reviewed following labs and imaging studies  CBC: Recent Labs  Lab 11/24/20 1911  WBC 7.1  NEUTROABS 4.2  HGB 11.8*  HCT 34.4*  MCV 89.4  PLT 161   Basic Metabolic Panel: Recent Labs  Lab 11/24/20 1911  NA 136  K 3.7  CL 101  CO2 26  GLUCOSE 90  BUN 13  CREATININE 0.76  CALCIUM 8.4*   Liver Function Tests: No results for input(s): AST, ALT, ALKPHOS, BILITOT, PROT, ALBUMIN in the last 168 hours. Coagulation Profile: No results for input(s): INR, PROTIME in the last 168 hours. BNP (last 3 results) No results for input(s): PROBNP in the last 8760 hours. CBG: No results for input(s): GLUCAP in the last 168 hours. Thyroid Function Tests: No results for input(s): TSH, T4TOTAL, FREET4, T3FREE, THYROIDAB in the last 72 hours. Urine analysis:    Component Value Date/Time   COLORURINE YELLOW (A) 11/24/2020 1911   APPEARANCEUR CLEAR (A) 11/24/2020 1911   APPEARANCEUR Clear 07/28/2014 1806   LABSPEC 1.021 11/24/2020 1911   LABSPEC 1.035 07/28/2014 1806   PHURINE 5.0 11/24/2020 1911   GLUCOSEU NEGATIVE 11/24/2020 1911   GLUCOSEU Negative 07/28/2014 1806   HGBUR SMALL (A) 11/24/2020 1911   BILIRUBINUR NEGATIVE 11/24/2020 1911   BILIRUBINUR Negative 07/28/2014 1806   KETONESUR NEGATIVE 11/24/2020 1911   PROTEINUR NEGATIVE 11/24/2020 1911   NITRITE NEGATIVE 11/24/2020 1911   LEUKOCYTESUR NEGATIVE 11/24/2020 1911   LEUKOCYTESUR Negative 07/28/2014 1806     Radiological Exams on Admission: No results found.  EKG: Independently reviewed.  Sinus tachycardia  Assessment/Plan  Principal Problem Recurrent seizures -Concern for recurrent seizure activity given postictal state reported by the wife as well as tongue biting. -Has had couple  CT scan earlier this year without acute findings, obtain an MRI -Obtain EEG -Start Keppra, consult neurology in the morning  Active Problems Essential hypertension -Resume home Coreg, lisinopril  Hyperlipidemia -Resume home statin  Chronic pain -Resume home medications  Tobacco use -Place nicotine patch  DVT prophylaxis: Lovenox  Code Status: Full code  Family Communication: wife at [place]ide  Disposition Plan: home when ready  Bed Type: medsurg with cardiac monitoring  Consults called: none  Obs/Inp: observation    Pamella Pert, MD, PhD [facility]  Contact via [email or web address]  11/24/2020, 10:09 PM

## 2020-11-24 NOTE — ED Provider Notes (Signed)
[facility] Emergency Department Provider Note  ____________________________________________  Time seen: Approximately 11:14 PM  I have reviewed the triage vital signs and the nursing notes.   HISTORY  Chief Complaint Seizures    HPI Nicholas Delgado is a 39 y.o. male with a past history of hypertension and recent new onset of seizures about 6 weeks ago who comes ED after a seizure this evening.  His wife reports that he has had 3 seizures today.  He had a first visit with neurology on October 28, was recommended to undergo EEG testing, but that has not happened yet.  Has had no further follow-up, not taking any medications other than antihypertensives, Lyrica trazodone oxycodone antidepressant.  No recent trauma, no fever or neck stiffness.  No alcohol.      Past Medical History:  Diagnosis Date  . Hypertension      Patient Active Problem List   Diagnosis Date Noted  . Seizure (HCC) 11/24/2020  . Tobacco use disorder 08/25/2015  . Opioid use with withdrawal (HCC) 08/25/2015  . 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 HTN (hypertension) 08/25/2015  . Dyslipidemia 08/25/2015  . Opioid use disorder, severe, dependence (HCC) 08/24/2015     Past Surgical History:  Procedure Laterality Date  . arm surgery Left      Prior to Admission medications   Medication Sig Start Date End Date Taking? Authorizing Provider  atorvastatin (LIPITOR) 20 MG tablet Take 20 mg by mouth at bedtime.     [provider]  carvedilol (COREG) 6.25 MG tablet Take 6.25 mg by mouth 2 (two) times daily with a meal.    [provider]  escitalopram (LEXAPRO) 20 MG tablet Take 20 mg by mouth daily. 06/03/20   [provider]  lisinopril (PRINIVIL,ZESTRIL) 20 MG tablet Take 20 mg by mouth daily.    [provider]  Oxycodone HCl 10 MG TABS Take 10 mg by mouth every 6 (six) hours. 06/25/20   [provider]  pregabalin (LYRICA) 150 MG capsule Take 150 mg by mouth 3 (three) times daily. 06/18/20   [provider]  traZODone (DESYREL) 50 MG tablet Take 50-100 mg by mouth at bedtime. 06/03/20   [provider]     Allergies Sulfa antibiotics   No family history on file.  Social History Social History   Tobacco Use  . Smoking status: Current Every Day Smoker    Packs/day: 2.00  . Smokeless tobacco: Never Used  Substance Use Topics  . Alcohol use: No  . Drug use: Yes    Comment: percocet 10mg     Review of Systems  Constitutional:   No fever or chills.  ENT:   No sore throat. No rhinorrhea. Cardiovascular:   No chest pain or syncope. Respiratory:   No dyspnea or cough. Gastrointestinal:   Negative for abdominal pain, vomiting and diarrhea.  Musculoskeletal:   Negative for focal pain or swelling All other systems reviewed and are negative except as documented above in ROS and HPI.  ____________________________________________   PHYSICAL EXAM:  VITAL SIGNS: ED Triage Vitals  Enc Vitals Group     BP 11/24/20 1907 135/90     Pulse Rate 11/24/20 1907 97     Resp 11/24/20 1907 18     Temp 11/24/20 1907 98.4 F (36.9 C)     Temp Source 11/24/20 1907 Oral     SpO2 11/24/20 1907 100 %     Weight 11/24/20 1909  230 lb (104.3 kg)     Height 11/24/20 1909 6' (1.829 m)     Head Circumference --      Peak Flow --      Pain Score 11/24/20 1908 7     Pain Loc --      Pain Edu? --      Excl. in GC? --     Vital signs reviewed, nursing assessments reviewed.   Constitutional:   Alert and oriented to person and place. Non-toxic appearance. Eyes:   Conjunctivae are normal. EOMI. PERRL. ENT      Head:   Normocephalic and atraumatic.      Nose:   Wearing a mask.      Mouth/Throat:   Wearing a mask.      Neck:   No meningismus. Full ROM. Hematological/Lymphatic/Immunilogical:   No cervical lymphadenopathy. Cardiovascular:   RRR. Symmetric bilateral  radial and DP pulses.  No murmurs. Cap refill less than 2 seconds. Respiratory:   Normal respiratory effort without tachypnea/retractions. Breath sounds are clear and equal bilaterally. No wheezes/rales/rhonchi. Gastrointestinal:   Soft and nontender. Non distended. There is no CVA tenderness.  No rebound, rigidity, or guarding.  Musculoskeletal:   Normal range of motion in all extremities. No joint effusions.  No lower extremity tenderness.  No edema. Neurologic:   Normal speech and language.  Cranial nerves III through XII intact Balance and coordination intact Motor grossly intact. No acute focal neurologic deficits are appreciated.  Skin:    Skin is warm, dry and intact. No rash noted.  No petechiae, purpura, or bullae.  ____________________________________________    LABS (pertinent positives/negatives) (all labs ordered are listed, but only abnormal results are displayed) Labs Reviewed  BASIC METABOLIC PANEL - Abnormal; Notable for the following components:      Result Value   Calcium 8.4 (*)    All other components within normal limits  CBC WITH DIFFERENTIAL/PLATELET - Abnormal; Notable for the following components:   RBC 3.85 (*)    Hemoglobin 11.8 (*)    HCT 34.4 (*)    All other components within normal limits  URINALYSIS, COMPLETE (UACMP) WITH MICROSCOPIC - Abnormal; Notable for the following components:   Color, Urine YELLOW (*)    APPearance CLEAR (*)    Hgb urine dipstick SMALL (*)    All other components within normal limits  URINE DRUG SCREEN, QUALITATIVE ([facility] ONLY) - Abnormal; Notable for the following components:   Tricyclic, Ur Screen POSITIVE (*)    All other components within normal limits  RESP PANEL BY RT-PCR (FLU A&B, COVID) ARPGX2  ETHANOL   ____________________________________________   EKG    ____________________________________________    RADIOLOGY  No results  found.  ____________________________________________   PROCEDURES Procedures  ____________________________________________    CLINICAL IMPRESSION / ASSESSMENT AND PLAN / ED COURSE  Medications ordered in the ED: Medications  nicotine (NICODERM CQ - dosed in mg/24 hours) patch 21 mg (21 mg Transdermal Patch Applied 11/24/20 2120)  levETIRAcetam (KEPPRA) IVPB 1000 mg/100 mL premix (0 mg Intravenous Stopped 11/24/20 2201)  sodium chloride 0.9 % bolus 1,000 mL (0 mLs Intravenous Stopped 11/24/20 2201)  ketorolac (TORADOL) 30 MG/ML injection 15 mg (15 mg Intravenous Given 11/24/20 2121)  LORazepam (ATIVAN) injection 2 mg (1 mg Intravenous Given 11/24/20 2130)    Pertinent labs & imaging results that were available during my care of the patient were reviewed by me and considered in my medical decision making (see chart for details).  Cathi Roan  Rabideau was evaluated in Emergency Department on 11/24/2020 for the symptoms described in the history of present illness. He was evaluated in the context of the global COVID-19 pandemic, which necessitated consideration that the patient might be at risk for infection with the SARS-CoV-2 virus that causes COVID-19. Institutional protocols and algorithms that pertain to the evaluation of patients at risk for COVID-19 are in a state of rapid change based on information released by regulatory bodies including the CDC and federal and state organizations. These policies and algorithms were followed during the patient's care in the ED.   Patient presents with multiple seizures today.  He has not yet undergone any seizure work-up other than having a previous CT scan of the head which was unremarkable.  Labs today unremarkable, vital signs normal.  He is confused on initial presentation due to postictal state, but overall neuro intact.  Recommended hospitalization for monitoring, in-house EEG, neuro consult given the acceleration of his symptoms to which he agrees.   Will give Toradol for relief of left foot pain, nicotine patch as needed, Ativan for anxiety.  Discussed with hospitalist who will plan to give him a dose of Keppra as well.      ____________________________________________   FINAL CLINICAL IMPRESSION(S) / ED DIAGNOSES    Final diagnoses:  Seizure [facility])     ED Discharge Orders    None      Portions of this note were generated with dragon dictation software. Dictation errors may occur despite best attempts at proofreading.   Sharman Cheek, MD 11/24/20 [phone]

## 2020-11-24 NOTE — ED Triage Notes (Signed)
Pt arrives with EMS. Witnessed seizure while sitting in a vehicle. Reported that this was the 3rd seizure in the last 24 hrs. Seizures started 6 weeks ago and has been seeing neurology for same.

## 2020-11-24 NOTE — ED Notes (Signed)
No seizure activity noted, wife at bedside.  Pt is A&Ox4.

## 2020-11-24 NOTE — ED Notes (Signed)
To MRI via wheelchair.

## 2020-11-25 NOTE — ED Notes (Signed)
Pt walking around in hallway and back into room 13 where he was moved from. This RN asked to assist with needs and pt request headphones for his phone. Pt informed that we unfortunately do not have these accommodations. Pt voices that he will just play his phone loudly in hallway because we have moved him where he has to look at walls. Pt informed that the ED as well as the hospital is extremely full and that we are doing everything we can to move and re-arrange pts to accomodates the volumes. Pt continues to speak loudly and informs that he has no privacy and that he will be walking around and listening to his phone loudly anyway. Pt continues to raise voice and this RN calls for security. Pt upset when security arrives and sts, "Im from the other side of the track where when people push, I push back." Pt provided with more explanation of why it is important to stay calm and that as soon as staff can, he would be moved into a room, that this arrangement is only temporary, due to census circumstances. Pt continues and asked to leave. Pt educated on the importance of his admission and the concern due to his new onset of seizures. Pt refusing to listen, talking over this RN. This RN steps away to print AMA form once house supervisor called and in agreeance. Pts original nurse at bedside with security to remove IV x2 and have pt sign AMA form. This RN attempts to explain what the AMA stands for but pt refuses.  Pt escorted to WR by security for safety.    AMA form that is returned to this RN from the original RN Marcelino Duster, has "Screw [place]" as the pt signature. Both Statistician sign as witnesses's.

## 2020-11-25 NOTE — ED Notes (Signed)
Pt did not sign name to AMA form, he signed "screw [place]".  Charge nurse aware and signed with me for pt refusal to sign AMA.

## 2020-11-25 NOTE — ED Notes (Signed)
AMA signed, pt sts that he will call his brother to come and get him.

## 2020-11-26 NOTE — Discharge Summary (Signed)
Physician AGAINST MEDICAL ADVICE discharge Summary  Nicholas Delgado IRW:[id number] DOB: 12/22/1980 DOA: 11/24/2020  PCP: Geraldo Pitter, MD  Admit date: 11/24/2020 Discharge date: 11/25/2020  Admitted From: home Disposition: Left AMA  HPI / Hospital Course / Discharge diagnoses: Patient admitted 12/6 in the evening, please see H&P for full details but briefly due to seizures.  He left AMA shortly after admission and prior to me having a chance to discuss this with the patient  Discharge Instructions   Allergies as of 11/25/2020      Reactions   Sulfa Antibiotics Shortness Of Breath, Anaphylaxis   OTHER REACTION      Medication List    ASK your doctor about these medications   amitriptyline 25 MG tablet Commonly known as: ELAVIL Take 25 mg by mouth at bedtime.   atorvastatin 20 MG tablet Commonly known as: LIPITOR Take 20 mg by mouth at bedtime.   carvedilol 6.25 MG tablet Commonly known as: COREG Take 6.25 mg by mouth 2 (two) times daily with a meal.   escitalopram 20 MG tablet Commonly known as: LEXAPRO Take 20 mg by mouth daily.   gabapentin 600 MG tablet Commonly known as: NEURONTIN Take 600 mg by mouth 3 (three) times daily.   lisinopril 20 MG tablet Commonly known as: ZESTRIL Take 20 mg by mouth daily.   Oxycodone HCl 10 MG Tabs Take 10 mg by mouth every 6 (six) hours.   pregabalin 150 MG capsule Commonly known as: LYRICA Take 150 mg by mouth 3 (three) times daily.   pregabalin 50 MG capsule Commonly known as: LYRICA Take 1 capsule by mouth at bedtime.   Suboxone 4-1 MG Film Generic drug: Buprenorphine HCl-Naloxone HCl Place under the tongue.   traZODone 50 MG tablet Commonly known as: DESYREL Take 50-100 mg by mouth at bedtime.      Consultations:  None   Procedures/Studies:  MR BRAIN WO CONTRAST  Result Date: 11/24/2020 CLINICAL DATA:  39 year old male with witnessed seizure, 3 seizures in the past 24 hours. EXAM: MRI HEAD WITHOUT  CONTRAST TECHNIQUE: Multiplanar, multiecho pulse sequences of the brain and surrounding structures were obtained without intravenous contrast. COMPARISON:  Head CTs 10/15/2020 and earlier. FINDINGS: Brain: Overall cerebral volume is within normal limits. No restricted diffusion to suggest acute infarction. No midline shift, mass effect, evidence of mass lesion, ventriculomegaly, extra-axial collection or acute intracranial hemorrhage. Cervicomedullary junction and pituitary are within normal limits. Scattered cerebral white matter T2 and FLAIR hyperintensity in both hemispheres, more pronounced in the subcortical rather than the periventricular white matter and moderately advanced for age. No superimposed cortical encephalomalacia or chronic cerebral blood products identified. The deep gray matter nuclei are within normal limits, but there is moderate patchy T2 and FLAIR hyperintensity in the pons. Cerebellum is within normal limits. Thin slice coronal imaging of the temporal lobes is motion degraded despite repeated imaging attempts. However, there is no definite asymmetry in size or signal of the hippocampal formations (series 19, image 15). No migrational abnormality is identified. Vascular: Major intracranial vascular flow voids are grossly preserved. Skull and upper cervical spine: Negative visible cervical spine, negative visible cervical spine. Probable benign sclerosis at the tip of the clivus (series 5, image 13) with other visible bone marrow signal within normal limits. Sinuses/Orbits: Negative orbits. Trace paranasal sinus mucosal thickening. Other: Chronic right middle ear and mastoid opacification. Left mastoids remain clear. IMPRESSION: 1. No acute intracranial abnormality identified. 2. Moderately advanced for age but nonspecific signal changes in  the bilateral cerebral white matter and pons. 3. Motion degraded thin slice temporal lobe imaging with no convincing mesial temporal sclerosis. 4. Chronic  right middle ear and mastoid opacification. Electronically Signed   By: Odessa Fleming M.D.   On: 11/24/2020 23:55      The results of significant diagnostics from this hospitalization (including imaging, microbiology, ancillary and laboratory) are listed below for reference.     Microbiology: Recent Results (from the past 240 hour(s))  Resp Panel by RT-PCR (Flu A&B, Covid) Nasopharyngeal Swab     Status: None   Collection Time: 11/24/20  9:10 PM   Specimen: Nasopharyngeal Swab; Nasopharyngeal(NP) swabs in vial transport medium  Result Value Ref Range Status   SARS Coronavirus 2 by RT PCR NEGATIVE NEGATIVE Final    Comment: (NOTE) SARS-CoV-2 target nucleic acids are NOT DETECTED.  The SARS-CoV-2 RNA is generally detectable in upper respiratory specimens during the acute phase of infection. The lowest concentration of SARS-CoV-2 viral copies this assay can detect is 138 copies/mL. A negative result does not preclude SARS-Cov-2 infection and should not be used as the sole basis for treatment or other patient management decisions. A negative result may occur with  improper specimen collection/handling, submission of specimen other than nasopharyngeal swab, presence of viral mutation(s) within the areas targeted by this assay, and inadequate number of viral copies(<138 copies/mL). A negative result must be combined with clinical observations, patient history, and epidemiological information. The expected result is Negative.  Fact Sheet for Patients:  BloggerCourse.com  Fact Sheet for Healthcare Providers:  SeriousBroker.it  This test is no t yet approved or cleared by the [place] FDA and  has been authorized for detection and/or diagnosis of SARS-CoV-2 by FDA under an Emergency Use Authorization (EUA). This EUA will remain  in effect (meaning this test can be used) for the duration of the COVID-19 declaration under Section 564(b)(1)  of the Act, 21 U.S.C.section 360bbb-3(b)(1), unless the authorization is terminated  or revoked sooner.       Influenza A by PCR NEGATIVE NEGATIVE Final   Influenza B by PCR NEGATIVE NEGATIVE Final    Comment: (NOTE) The Xpert Xpress SARS-CoV-2/FLU/RSV plus assay is intended as an aid in the diagnosis of influenza from Nasopharyngeal swab specimens and should not be used as a sole basis for treatment. Nasal washings and aspirates are unacceptable for Xpert Xpress SARS-CoV-2/FLU/RSV testing.  Fact Sheet for Patients: BloggerCourse.com  Fact Sheet for Healthcare Providers: SeriousBroker.it  This test is not yet approved or cleared by the [place] FDA and has been authorized for detection and/or diagnosis of SARS-CoV-2 by FDA under an Emergency Use Authorization (EUA). This EUA will remain in effect (meaning this test can be used) for the duration of the COVID-19 declaration under Section 564(b)(1) of the Act, 21 U.S.C. section 360bbb-3(b)(1), unless the authorization is terminated or revoked.  Performed at [facility], [address] [place], [place] [address]      Labs: Basic Metabolic Panel: Recent Labs  Lab 11/24/20 1911  NA 136  K 3.7  CL 101  CO2 26  GLUCOSE 90  BUN 13  CREATININE 0.76  CALCIUM 8.4*   Liver Function Tests: No results for input(s): AST, ALT, ALKPHOS, BILITOT, PROT, ALBUMIN in the last 168 hours. CBC: Recent Labs  Lab 11/24/20 1911  WBC 7.1  NEUTROABS 4.2  HGB 11.8*  HCT 34.4*  MCV 89.4  PLT 161   CBG: No results for input(s): GLUCAP in the last 168  hours. Hgb A1c No results for input(s): HGBA1C in the last 72 hours. Lipid Profile No results for input(s): CHOL, HDL, LDLCALC, TRIG, CHOLHDL, LDLDIRECT in the last 72 hours. Thyroid function studies No results for input(s): TSH, T4TOTAL, T3FREE, THYROIDAB in the last 72 hours.  Invalid input(s): FREET3 Urinalysis     Component Value Date/Time   COLORURINE YELLOW (A) 11/24/2020 1911   APPEARANCEUR CLEAR (A) 11/24/2020 1911   APPEARANCEUR Clear 07/28/2014 1806   LABSPEC 1.021 11/24/2020 1911   LABSPEC 1.035 07/28/2014 1806   PHURINE 5.0 11/24/2020 1911   GLUCOSEU NEGATIVE 11/24/2020 1911   GLUCOSEU Negative 07/28/2014 1806   HGBUR SMALL (A) 11/24/2020 1911   BILIRUBINUR NEGATIVE 11/24/2020 1911   BILIRUBINUR Negative 07/28/2014 1806   KETONESUR NEGATIVE 11/24/2020 1911   PROTEINUR NEGATIVE 11/24/2020 1911   NITRITE NEGATIVE 11/24/2020 1911   LEUKOCYTESUR NEGATIVE 11/24/2020 1911   LEUKOCYTESUR Negative 07/28/2014 1806    FURTHER DISCHARGE INSTRUCTIONS:   Get Medicines reviewed and adjusted: Please take all your medications with you for your next visit with your Primary MD   Laboratory/radiological data: Please request your Primary MD to go over all hospital tests and procedure/radiological results at the follow up, please ask your Primary MD to get all Hospital records sent to his/her office.   In some cases, they will be blood work, cultures and biopsy results pending at the time of your discharge. Please request that your primary care M.D. goes through all the records of your hospital data and follows up on these results.   Also Note the following: If you experience worsening of your admission symptoms, develop shortness of breath, life threatening emergency, suicidal or homicidal thoughts you must seek medical attention immediately by calling 911 or calling your MD immediately  if symptoms less severe.   You must read complete instructions/literature along with all the possible adverse reactions/side effects for all the Medicines you take and that have been prescribed to you. Take any new Medicines after you have completely understood and accpet all the possible adverse reactions/side effects.    Do not drive when taking Pain medications or sleeping medications (Benzodaizepines)   Do not  take more than prescribed Pain, Sleep and Anxiety Medications. It is not advisable to combine anxiety,sleep and pain medications without talking with your primary care practitioner   Special Instructions: If you have smoked or chewed Tobacco  in the last 2 yrs please stop smoking, stop any regular Alcohol  and or any Recreational drug use.   Wear Seat belts while driving.   Please note: You were cared for by a hospitalist during your hospital stay. Once you are discharged, your primary care physician will handle any further medical issues. Please note that NO REFILLS for any discharge medications will be authorized once you are discharged, as it is imperative that you return to your primary care physician (or establish a relationship with a primary care physician if you do not have one) for your post hospital discharge needs so that they can reassess your need for medications and monitor your lab values.  Time coordinating discharge: 5 minutes  SIGNED:  Pamella Pert, MD, PhD 11/26/2020, 3:36 PM

## 2021-01-03 ENCOUNTER — Emergency Department: Payer: Medicare Other

## 2021-01-03 ENCOUNTER — Other Ambulatory Visit: Payer: Self-pay

## 2021-01-03 ENCOUNTER — Emergency Department
Admission: EM | Admit: 2021-01-03 | Discharge: 2021-01-03 | Disposition: A | Discharge status: Home / Self Care | Payer: Medicare Other | Attending: Emergency Medicine | Admitting: Emergency Medicine

## 2021-01-03 DIAGNOSIS — I10 Essential (primary) hypertension: Secondary | ICD-10-CM | POA: Insufficient documentation

## 2021-01-03 DIAGNOSIS — R569 Unspecified convulsions: Secondary | ICD-10-CM | POA: Diagnosis present

## 2021-01-03 DIAGNOSIS — F17210 Nicotine dependence, cigarettes, uncomplicated: Secondary | ICD-10-CM | POA: Diagnosis not present

## 2021-01-03 DIAGNOSIS — Z79899 Other long term (current) drug therapy: Secondary | ICD-10-CM | POA: Diagnosis not present

## 2021-01-03 LAB — COMPREHENSIVE METABOLIC PANEL
ALT: 34 U/L (ref 0–44)
AST: 24 U/L (ref 15–41)
Albumin: 4.5 g/dL (ref 3.5–5.0)
Alkaline Phosphatase: 84 U/L (ref 38–126)
Anion gap: 11 (ref 5–15)
BUN: 18 mg/dL (ref 6–20)
CO2: 25 mmol/L (ref 22–32)
Calcium: 9.4 mg/dL (ref 8.9–10.3)
Chloride: 105 mmol/L (ref 98–111)
Creatinine, Ser: 0.62 mg/dL (ref 0.61–1.24)
GFR, Estimated: >60 mL/min (ref >60)
Glucose, Bld: 131 mg/dL — ABNORMAL HIGH (ref 70–99)
Potassium: 3.6 mmol/L (ref 3.5–5.1)
Sodium: 141 mmol/L (ref 135–145)
Total Bilirubin: 0.7 mg/dL (ref 0.3–1.2)
Total Protein: 8 g/dL (ref 6.5–8.1)

## 2021-01-03 LAB — CBC WITH DIFFERENTIAL/PLATELET
Abs Immature Granulocytes: 0.06 10*3/uL (ref 0.00–0.07)
Basophils Absolute: 0 10*3/uL (ref 0.0–0.1)
Basophils Relative: 0 %
Eosinophils Absolute: 0 10*3/uL (ref 0.0–0.5)
Eosinophils Relative: 0 %
HCT: 42.1 % (ref 39.0–52.0)
Hemoglobin: 14.4 g/dL (ref 13.0–17.0)
Immature Granulocytes: 1 %
Lymphocytes Relative: 7 %
Lymphs Abs: 0.9 10*3/uL (ref 0.7–4.0)
MCH: 30.3 pg (ref 26.0–34.0)
MCHC: 34.2 g/dL (ref 30.0–36.0)
MCV: 88.4 fL (ref 80.0–100.0)
Monocytes Absolute: 0.5 10*3/uL (ref 0.1–1.0)
Monocytes Relative: 4 %
Neutro Abs: 10.8 10*3/uL — ABNORMAL HIGH (ref 1.7–7.7)
Neutrophils Relative %: 88 %
Platelets: 164 10*3/uL (ref 150–400)
RBC: 4.76 MIL/uL (ref 4.22–5.81)
RDW: 14.5 % (ref 11.5–15.5)
WBC: 12.3 10*3/uL — ABNORMAL HIGH (ref 4.0–10.5)
nRBC: 0 % (ref 0.0–0.2)

## 2021-01-03 LAB — MAGNESIUM: Magnesium: 2.3 mg/dL (ref 1.7–2.4)

## 2021-01-03 LAB — ETHANOL: Alcohol, Ethyl (B): <10 mg/dL (ref <10)

## 2021-01-03 MED ORDER — LEVETIRACETAM IN NACL 1000 MG/100ML IV SOLN
2000.0000 mg | Freq: Once | INTRAVENOUS | Status: AC
  Administered 2021-01-03: 2000 mg via INTRAVENOUS
  Filled 2021-01-03: qty 200

## 2021-01-03 MED ORDER — LEVETIRACETAM 500 MG PO TABS: 500.0000 mg | ORAL_TABLET | Freq: Two times a day (BID) | ORAL | 0 refills | Status: DC

## 2021-01-03 MED ORDER — LEVETIRACETAM IN NACL 1500 MG/100ML IV SOLN
1500.0000 mg | Freq: Once | INTRAVENOUS | Status: DC
Start: 2021-01-03 — End: 2021-01-03

## 2021-01-03 MED ORDER — SODIUM CHLORIDE 0.9 % IV SOLN
2000.0000 mg | Freq: Once | INTRAVENOUS | Status: DC
  Filled 2021-01-03: qty 20

## 2021-01-03 NOTE — ED Notes (Signed)
No sig pad available. Pt verbalized understanding of AVS and stated he had no questions.

## 2021-01-03 NOTE — Discharge Instructions (Addendum)
Take the Keppra to help prevent seizures.  You to call your neurology team and state that you were seen in the ER and you need an ER follow-up as soon as possible for recurrent seizures.  If you have not had an MRI brain with and without contrast they will need to order this for you outpatient.  Per [facility] statutes, patients with seizures are not allowed to drive until  they have been seizure-free for six months. Use caution when using heavy equipment or power tools. Avoid working on ladders or at heights. Take showers instead of baths. Ensure the water temperature is not too high on the home water heater. Do not go swimming alone. When caring for infants or small children, sit down when holding, feeding, or changing them to minimize risk of injury to the child in the event you have a seizure.   Also, Maintain good sleep hygiene. Avoid alcohol.

## 2021-01-03 NOTE — ED Notes (Signed)
Pt removed PIV.

## 2021-01-03 NOTE — ED Provider Notes (Signed)
[facility] Emergency Department Provider Note  ____________________________________________   Event Date/Time   First MD Initiated Contact with Patient 01/03/21 1235     (approximate)  I have reviewed the triage vital signs and the nursing notes.   HISTORY  Chief Complaint Seizures    HPI Nicholas Delgado is a 40 y.o. male with history of seizures who comes in with concern for seizure.  Reportedly patient had seizures a few months ago.  He was seen by neurology and was never started on seizure medication.  He was seizure-free for about a month until today.  Today he has had 3 seizures.  These were witnessed by his wife.  Reported his [place].  Currently the patient only has some headaches, mild, constant, nothing makes it better, nothing makes it worse.  Denies any chest pain, shortness of breath, abdominal pain.  On review of records it appears that patient did have an EEG done in December with Dr. Malvin Johns at [facility].  At that time patient's EEG was normal.  I did call patient's wife who reported Started having seizures with 8 min, hands tense, head jerks back, and then everything tenses up in body.  Had them for 2 months but then no seizures for one month. Doesn't go back till Feb 12 to neurology. No medications changes.Fell in the bathroom not sure if had seizure.  This happened at 2AM. Seemed confused.  Then 9AM 5 minutes of body tensing up in bed. Then second one about 1 minute witnessed by the son but not by the wife..  Remained confused throughout the time. Then about 1 hour later had a third one last 2-3 minutes.  Stopped seizing but was confused after that. Wasn't following instructions.  Did not bite his tongue.  Not urinating on himself.  No alcohol or drugs.            Past Medical History:  Diagnosis Date  . Hypertension     Patient Active Problem List   Diagnosis Date Noted  . Seizure (HCC) 11/24/2020  . Tobacco use disorder  08/25/2015  . Opioid use with withdrawal (HCC) 08/25/2015  . 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 HTN (hypertension) 08/25/2015  . Dyslipidemia 08/25/2015  . Opioid use disorder, severe, dependence (HCC) 08/24/2015    Past Surgical History:  Procedure Laterality Date  . arm surgery Left     Prior to Admission medications   Medication Sig Start Date End Date Taking? Authorizing Provider  amitriptyline (ELAVIL) 25 MG tablet Take 25 mg by mouth at bedtime. 10/15/20   [provider]  atorvastatin (LIPITOR) 20 MG tablet Take 20 mg by mouth at bedtime.     [provider]  carvedilol (COREG) 6.25 MG tablet Take 6.25 mg by mouth 2 (two) times daily with a meal.    [provider]  escitalopram (LEXAPRO) 20 MG tablet Take 20 mg by mouth daily. 06/03/20   [provider]  gabapentin (NEURONTIN) 600 MG tablet Take 600 mg by mouth 3 (three) times daily. 10/13/20   [provider]  lisinopril (PRINIVIL,ZESTRIL) 20 MG tablet Take 20 mg by mouth daily.    [provider]  Oxycodone HCl 10 MG TABS Take 10 mg by mouth every 6 (six) hours. 06/25/20   [provider]  pregabalin (LYRICA) 150 MG capsule Take 150 mg by mouth 3 (three) times daily. 06/18/20   [provider]  pregabalin (LYRICA) 50 MG  capsule Take 1 capsule by mouth at bedtime. 10/19/20   [provider]  SUBOXONE 4-1 MG FILM Place under the tongue. 10/01/20   [provider]  traZODone (DESYREL) 50 MG tablet Take 50-100 mg by mouth at bedtime. 06/03/20   [provider]    Allergies Sulfa antibiotics  No family history on file.  Social History Social History   Tobacco Use  . Smoking status: Current Every Day Smoker    Packs/day: 2.00  . Smokeless tobacco: Never Used  Substance Use Topics  . Alcohol use: No  . Drug use: Yes    Comment: percocet 10mg       Review of  Systems Constitutional: No fever/chills Eyes: No visual changes. ENT: No sore throat. Cardiovascular: Denies chest pain. Respiratory: Denies shortness of breath. Gastrointestinal: No abdominal pain.  No nausea, no vomiting.  No diarrhea.  No constipation. Genitourinary: Negative for dysuria. Musculoskeletal: Negative for back pain. Skin: Negative for rash. Neurological:  No  focal weakness or numbness.  Seizure-like activity, headache. All other ROS negative ____________________________________________   PHYSICAL EXAM:  VITAL SIGNS: Vitals:   01/03/21 1236  BP: (!) 143/107  Pulse: (!) 108  Resp: 18  Temp: 98.3 F (36.8 C)  SpO2: 94%     Constitutional: Alert and oriented although does appear slightly confused Eyes: Conjunctivae are normal. EOMI. Head: Atraumatic. Nose: No congestion/rhinnorhea. Mouth/Throat: Mucous membranes are moist.   Neck: No stridor. Trachea Midline. FROM Cardiovascular: Normal rate, regular rhythm. Grossly normal heart sounds.  Good peripheral circulation. Respiratory: Normal respiratory effort.  No retractions. Lungs CTAB. Gastrointestinal: Soft and nontender. No distention. No abdominal bruits.  Musculoskeletal: No lower extremity tenderness nor edema.  No joint effusions. Neurologic:  Normal speech and language. No gross focal neurologic deficits are appreciated.  Equal strength in arms and legs.  Skin:  Skin is warm, dry and intact. No rash noted. Psychiatric: Mood and affect are normal. Speech and behavior are normal. GU: Deferred   ____________________________________________   LABS (all labs ordered are listed, but only abnormal results are displayed)  Labs Reviewed  CBC WITH DIFFERENTIAL/PLATELET - Abnormal; Notable for the following components:      Result Value   WBC 12.3 (*)    Neutro Abs 10.8 (*)    All other components within normal limits  COMPREHENSIVE METABOLIC PANEL - Abnormal; Notable for the following components:    Glucose, Bld 131 (*)    All other components within normal limits  ETHANOL  MAGNESIUM  URINE DRUG SCREEN, QUALITATIVE ([facility] ONLY)  URINALYSIS, COMPLETE ([facility]) WITH MICROSCOPIC   ____________________________________________ RADIOLOGY   Official radiology report(s): CT Head Wo Contrast  Result Date: 01/03/2021 CLINICAL DATA:  Pt from home. Per EMS, pt had first seizure 3-4 months ago. "Constant" seizures x 1 month. Was seen by neurology. Spontaneously resolved. This morning had 3 seizures. No seizure medication ,now c/o headaches EXAM: CT HEAD WITHOUT CONTRAST CT CERVICAL SPINE WITHOUT CONTRAST TECHNIQUE: Multidetector CT imaging of the head and cervical spine was performed following the standard protocol without intravenous contrast. Multiplanar CT image reconstructions of the cervical spine were also generated. COMPARISON:  10/15/2020 FINDINGS: CT HEAD FINDINGS Brain: No evidence of acute infarction, hemorrhage, hydrocephalus, extra-axial collection or mass lesion/mass effect. Vascular: No hyperdense vessel or unexpected calcification. Skull: Normal. Negative for fracture or focal lesion. Sinuses/Orbits: Visualized globes and orbits are unremarkable. Visualized sinuses are clear. Opacified right mastoid air cells and middle ear cavity similar to the prior head CT. Other: None. CT  CERVICAL SPINE FINDINGS Alignment: Reversal of the normal cervical lordosis centered at C6. No spondylolisthesis. Skull base and vertebrae: No acute fracture. No primary bone lesion or focal pathologic process. Soft tissues and spinal canal: No prevertebral fluid or swelling. No visible canal hematoma. Disc levels: Mild loss of disc height with endplate spurring at C6-C7. Remaining disc spaces are well preserved. No significant disc bulging. No evidence of a disc herniation. No stenosis. Upper chest: Negative. Other: None. IMPRESSION: HEAD CT 1. No intracranial abnormality. 2. No skull fracture. 3. Opacified right mastoid air  cells and middle ear cavity stable compared to the prior head CT. CERVICAL CT 1. No fracture or acute finding. Electronically Signed   By: Amie Portland M.D.   On: 01/03/2021 13:46   CT Cervical Spine Wo Contrast  Result Date: 01/03/2021 CLINICAL DATA:  Pt from home. Per EMS, pt had first seizure 3-4 months ago. "Constant" seizures x 1 month. Was seen by neurology. Spontaneously resolved. This morning had 3 seizures. No seizure medication ,now c/o headaches EXAM: CT HEAD WITHOUT CONTRAST CT CERVICAL SPINE WITHOUT CONTRAST TECHNIQUE: Multidetector CT imaging of the head and cervical spine was performed following the standard protocol without intravenous contrast. Multiplanar CT image reconstructions of the cervical spine were also generated. COMPARISON:  10/15/2020 FINDINGS: CT HEAD FINDINGS Brain: No evidence of acute infarction, hemorrhage, hydrocephalus, extra-axial collection or mass lesion/mass effect. Vascular: No hyperdense vessel or unexpected calcification. Skull: Normal. Negative for fracture or focal lesion. Sinuses/Orbits: Visualized globes and orbits are unremarkable. Visualized sinuses are clear. Opacified right mastoid air cells and middle ear cavity similar to the prior head CT. Other: None. CT CERVICAL SPINE FINDINGS Alignment: Reversal of the normal cervical lordosis centered at C6. No spondylolisthesis. Skull base and vertebrae: No acute fracture. No primary bone lesion or focal pathologic process. Soft tissues and spinal canal: No prevertebral fluid or swelling. No visible canal hematoma. Disc levels: Mild loss of disc height with endplate spurring at C6-C7. Remaining disc spaces are well preserved. No significant disc bulging. No evidence of a disc herniation. No stenosis. Upper chest: Negative. Other: None. IMPRESSION: HEAD CT 1. No intracranial abnormality. 2. No skull fracture. 3. Opacified right mastoid air cells and middle ear cavity stable compared to the prior head CT. CERVICAL CT 1.  No fracture or acute finding. Electronically Signed   By: Amie Portland M.D.   On: 01/03/2021 13:46    ____________________________________________   PROCEDURES  Procedure(s) performed (including Critical Care):  Procedures   ____________________________________________   INITIAL IMPRESSION / ASSESSMENT AND PLAN / ED COURSE  Nicholas Delgado was evaluated in Emergency Department on 01/03/2021 for the symptoms described in the history of present illness. He was evaluated in the context of the global COVID-19 pandemic, which necessitated consideration that the patient might be at risk for infection with the SARS-CoV-2 virus that causes COVID-19. Institutional protocols and algorithms that pertain to the evaluation of patients at risk for COVID-19 are in a state of rapid change based on information released by regulatory bodies including the CDC and federal and state organizations. These policies and algorithms were followed during the patient's care in the ED.    Patient is a 40 year old with possible seizure history comes in with concern for seizure.  Will get labs to evaluate for Electra abnormalities, dehydration.  Given the concern that he might of hit his head will get CT head and CT cervical evaluate for intracranial hemorrhage versus c ervical fracture.  Will discuss with  neurology given concern for possible recurrent seizures.  l.  D/w Dr. Otelia LimesLindzen recommend load 2g keppra, 500mg  BID keppra for home, outpt f/u MRI brain with and without, and seizure precautions.  At this time he feels that he is appropriate for discharge home and I am agreeable given patient is very well-appearing.  Reevaluated patient has been in the ER for over 2 hours.  He has had not had recurrent seizures and is well-appearing.  He states that he feels back to his baseline self.  I called the wife and she expressed understanding of the above and they feel comfortable with discharge home       ____________________________________________   FINAL CLINICAL IMPRESSION(S) / ED DIAGNOSES   Final diagnoses:  Seizure (HCC)      MEDICATIONS GIVEN DURING THIS VISIT:  Medications  levETIRAcetam (KEPPRA) IVPB 1000 mg/100 mL premix (0 mg Intravenous Stopped 01/03/21 1435)     ED Discharge Orders         Ordered    levETIRAcetam (KEPPRA) 500 MG tablet  2 times daily        01/03/21 1455           Note:  This document was prepared using Dragon voice recognition software and may include unintentional dictation errors.   Concha SeFunke, Arshia Spellman E, MD 01/03/21 [phone]

## 2021-01-03 NOTE — ED Triage Notes (Signed)
Pt from home. Per EMS, pt had first seizure 3-4 months ago. "Constant" seizures x 1 month. Was seen by neurology. Spontaneously resolved. This morning had 3 seizures. No seizure medication

## 2021-01-25 ENCOUNTER — Other Ambulatory Visit: Payer: Self-pay

## 2021-01-25 ENCOUNTER — Emergency Department
Admission: EM | Admit: 2021-01-25 | Discharge: 2021-01-25 | Disposition: A | Discharge status: Home / Self Care | Payer: Medicare Other | Attending: Emergency Medicine | Admitting: Emergency Medicine

## 2021-01-25 ENCOUNTER — Encounter: Payer: Self-pay | Admitting: Emergency Medicine

## 2021-01-25 DIAGNOSIS — I10 Essential (primary) hypertension: Secondary | ICD-10-CM | POA: Insufficient documentation

## 2021-01-25 DIAGNOSIS — F17200 Nicotine dependence, unspecified, uncomplicated: Secondary | ICD-10-CM | POA: Diagnosis not present

## 2021-01-25 DIAGNOSIS — R569 Unspecified convulsions: Secondary | ICD-10-CM | POA: Diagnosis present

## 2021-01-25 DIAGNOSIS — Z79899 Other long term (current) drug therapy: Secondary | ICD-10-CM | POA: Insufficient documentation

## 2021-01-25 HISTORY — DX: Unspecified convulsions: R56.9

## 2021-01-25 LAB — ETHANOL: Alcohol, Ethyl (B): <10 mg/dL (ref <10)

## 2021-01-25 LAB — CBC WITH DIFFERENTIAL/PLATELET
Abs Immature Granulocytes: 0.02 10*3/uL (ref 0.00–0.07)
Basophils Absolute: 0 10*3/uL (ref 0.0–0.1)
Basophils Relative: 0 %
Eosinophils Absolute: 0.1 10*3/uL (ref 0.0–0.5)
Eosinophils Relative: 1 %
HCT: 36.6 % — ABNORMAL LOW (ref 39.0–52.0)
Hemoglobin: 12.7 g/dL — ABNORMAL LOW (ref 13.0–17.0)
Immature Granulocytes: 0 %
Lymphocytes Relative: 28 %
Lymphs Abs: 2.2 10*3/uL (ref 0.7–4.0)
MCH: 30.7 pg (ref 26.0–34.0)
MCHC: 34.7 g/dL (ref 30.0–36.0)
MCV: 88.4 fL (ref 80.0–100.0)
Monocytes Absolute: 0.6 10*3/uL (ref 0.1–1.0)
Monocytes Relative: 8 %
Neutro Abs: 5 10*3/uL (ref 1.7–7.7)
Neutrophils Relative %: 63 %
Platelets: 146 10*3/uL — ABNORMAL LOW (ref 150–400)
RBC: 4.14 MIL/uL — ABNORMAL LOW (ref 4.22–5.81)
RDW: 13.9 % (ref 11.5–15.5)
WBC: 8 10*3/uL (ref 4.0–10.5)
nRBC: 0 % (ref 0.0–0.2)

## 2021-01-25 LAB — URINALYSIS, ROUTINE W REFLEX MICROSCOPIC
Bilirubin Urine: NEGATIVE
Glucose, UA: NEGATIVE mg/dL
Hgb urine dipstick: NEGATIVE
Leukocytes,Ua: NEGATIVE
Nitrite: NEGATIVE
Protein, ur: 30 mg/dL — AB
Specific Gravity, Urine: 1.025 (ref 1.005–1.030)
pH: 5.5 (ref 5.0–8.0)

## 2021-01-25 LAB — URINE DRUG SCREEN, QUALITATIVE (ARMC ONLY)
Amphetamines, Ur Screen: NOT DETECTED
Barbiturates, Ur Screen: NOT DETECTED
Benzodiazepine, Ur Scrn: POSITIVE — AB
Cannabinoid 50 Ng, Ur ~~LOC~~: NOT DETECTED
Cocaine Metabolite,Ur ~~LOC~~: NOT DETECTED
MDMA (Ecstasy)Ur Screen: NOT DETECTED
Methadone Scn, Ur: NOT DETECTED
Opiate, Ur Screen: POSITIVE — AB
Phencyclidine (PCP) Ur S: NOT DETECTED
Tricyclic, Ur Screen: POSITIVE — AB

## 2021-01-25 LAB — COMPREHENSIVE METABOLIC PANEL
ALT: 22 U/L (ref 0–44)
AST: 23 U/L (ref 15–41)
Albumin: 4.2 g/dL (ref 3.5–5.0)
Alkaline Phosphatase: 90 U/L (ref 38–126)
Anion gap: 8 (ref 5–15)
BUN: 13 mg/dL (ref 6–20)
CO2: 24 mmol/L (ref 22–32)
Calcium: 8.9 mg/dL (ref 8.9–10.3)
Chloride: 105 mmol/L (ref 98–111)
Creatinine, Ser: 0.81 mg/dL (ref 0.61–1.24)
GFR, Estimated: >60 mL/min (ref >60)
Glucose, Bld: 91 mg/dL (ref 70–99)
Potassium: 3.5 mmol/L (ref 3.5–5.1)
Sodium: 137 mmol/L (ref 135–145)
Total Bilirubin: 0.3 mg/dL (ref 0.3–1.2)
Total Protein: 7.1 g/dL (ref 6.5–8.1)

## 2021-01-25 LAB — CBG MONITORING, ED: Glucose-Capillary: 93 mg/dL (ref 70–99)

## 2021-01-25 MED ORDER — PREGABALIN 150 MG PO CAPS: 150.0000 mg | ORAL_CAPSULE | Freq: Three times a day (TID) | ORAL | 0 refills | Status: DC

## 2021-01-25 MED ORDER — PREGABALIN 50 MG PO CAPS: 50.0000 mg | ORAL_CAPSULE | Freq: Every day | ORAL | 0 refills | Status: DC

## 2021-01-25 MED ORDER — LEVETIRACETAM 500 MG PO TABS: 500.0000 mg | ORAL_TABLET | Freq: Two times a day (BID) | ORAL | 1 refills | Status: DC

## 2021-01-25 MED ORDER — LEVETIRACETAM IN NACL 1000 MG/100ML IV SOLN
1000.0000 mg | Freq: Once | INTRAVENOUS | Status: AC
  Administered 2021-01-25: 1000 mg via INTRAVENOUS
  Filled 2021-01-25: qty 100

## 2021-01-25 NOTE — ED Triage Notes (Signed)
Patient states that he has a history of seizures. Patient states that today he thinks that he has had 5 or 6 seizures. Patient states that he doesn't really remember. Patient states that they recently added Kepra.

## 2021-01-25 NOTE — ED Notes (Signed)
ED Provider at bedside. 

## 2021-01-25 NOTE — ED Notes (Signed)
Pt reports having seizure at home witnessed by mother. Pt does not know events of seizure. Pt is A&Ox4 at this time.  Pt also requests refill of home medications, reporting that he "dumped them all down the toilet" for an unknown reason yesterday. Pt reports mild body and head pain post seizure.

## 2021-01-25 NOTE — Discharge Instructions (Addendum)
Please follow-up closely with your outpatient neurologist.  Please note that you cannot drive for 6 months after your last seizure.  Please avoid any activity that may be dangerous to you if you were to have another seizure such as extreme sports (like rockclimbing, skydiving), swimming in a pool or bathing alone, cooking alone.  Please take your medications as prescribed.  You have decided to leave the emergency department without a complete work-up.  We were not able to consult neurology about further recommendations.  If you have another seizure or decide you would like to come back for further evaluation, please do so at any time.

## 2021-01-25 NOTE — ED Provider Notes (Signed)
[facility] Emergency Department Provider Note ____________________________________________   Event Date/Time   First MD Initiated Contact with Patient 01/25/21 [phone]     (approximate)  I have reviewed the triage vital signs and the nursing notes.   HISTORY  Chief Complaint Seizures    HPI Nicholas Delgado is a 40 y.o. male with history of seizure disorder, opiate abuse, hypertension, dyslipidemia who presents to the emergency department with multiple seizures over the past 2 days.  Most of the history is provided by patient's mother.  Spoke with mother by phone [phone].  She states the patient lives with her.  She reports he had two episodes today and three yesterday.  He refused to come to ED earlier.  She reports shaking all over.  He has history of grand mal seizures.  Last 10 minutes or less.  Post ictal afterwards.  No incontinence.  Patient states he feels sore all over.  He does not remember these events.  He denies any drug or alcohol use.  No fevers, cough, vomiting or diarrhea.  He is not on blood thinners.  No headache, neck or back pain.  No numbness or weakness.  He is followed by neurology with [facility] clinic.  He has had a normal EEG but states he is scheduled soon to have a 72-hour EEG.  He is on Keppra 500 mg twice daily reports compliance but states for some reason yesterday he flushed all of his medications down the toilet.  He states he does not know why he did this and states "my family thinks I am crazy".   Wife - [phone]  Past Medical History:  Diagnosis Date  . Hypertension   . Seizures [facility])     Patient Active Problem List   Diagnosis Date Noted  . Seizure (HCC) 11/24/2020  . Tobacco use disorder 08/25/2015  . Opioid use with withdrawal (HCC) 08/25/2015  . 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 HTN (hypertension) 08/25/2015  . Dyslipidemia 08/25/2015  . Opioid use disorder,  severe, dependence (HCC) 08/24/2015    Past Surgical History:  Procedure Laterality Date  . arm surgery Left     Prior to Admission medications   Medication Sig Start Date End Date Taking? Authorizing Provider  levETIRAcetam (KEPPRA) 500 MG tablet Take 1 tablet (500 mg total) by mouth 2 (two) times daily. 01/25/21  Yes Harveer Sadler, Layla Maw, DO  amitriptyline (ELAVIL) 25 MG tablet Take 25 mg by mouth at bedtime. 10/15/20   [provider]  atorvastatin (LIPITOR) 20 MG tablet Take 20 mg by mouth at bedtime.     [provider]  carvedilol (COREG) 6.25 MG tablet Take 6.25 mg by mouth 2 (two) times daily with a meal.    [provider]  escitalopram (LEXAPRO) 20 MG tablet Take 20 mg by mouth daily. 06/03/20   [provider]  gabapentin (NEURONTIN) 600 MG tablet Take 600 mg by mouth 3 (three) times daily. 10/13/20   [provider]  levETIRAcetam (KEPPRA) 500 MG tablet Take 1 tablet (500 mg total) by mouth 2 (two) times daily. 01/03/21 02/02/21  Concha Se, MD  lisinopril (PRINIVIL,ZESTRIL) 20 MG tablet Take 20 mg by mouth daily.    [provider]  Oxycodone HCl 10 MG TABS Take 10 mg by mouth every 6 (six) hours. 06/25/20   [provider]  pregabalin (LYRICA) 150 MG capsule Take 1 capsule (150 mg total) by mouth 3 (three) times daily.  01/25/21   Azra Abrell, Layla Maw, DO  pregabalin (LYRICA) 50 MG capsule Take 1 capsule (50 mg total) by mouth at bedtime. 01/25/21   Letia Guidry N, DO  SUBOXONE 4-1 MG FILM Place under the tongue. 10/01/20   [provider]  traZODone (DESYREL) 50 MG tablet Take 50-100 mg by mouth at bedtime. 06/03/20   [provider]    Allergies Sulfa antibiotics  No family history on file.  Social History Social History   Tobacco Use  . Smoking status: Current Every Day Smoker    Packs/day: 2.00  . Smokeless tobacco: Never Used  Substance Use Topics  . Alcohol use: No  . Drug use: Not Currently     Comment: percocet 10mg     Review of Systems Constitutional: No fever. Eyes: No visual changes. ENT: No sore throat. Cardiovascular: Denies chest pain. Respiratory: Denies shortness of breath. Gastrointestinal: No nausea, vomiting, diarrhea. Genitourinary: Negative for dysuria. Musculoskeletal: Negative for back pain. Skin: Negative for rash. Neurological: Negative for focal weakness or numbness.   ____________________________________________   PHYSICAL EXAM:  VITAL SIGNS: ED Triage Vitals  Enc Vitals Group     BP 01/25/21 0146 (!) 156/103     Pulse Rate 01/25/21 0146 (!) 113     Resp 01/25/21 0146 17     Temp 01/25/21 0146 99.3 F (37.4 C)     Temp Source 01/25/21 0146 Oral     SpO2 01/25/21 0146 98 %     Weight 01/25/21 0141 230 lb (104.3 kg)     Height 01/25/21 0141 6' (1.829 m)     Head Circumference --      Peak Flow --      Pain Score 01/25/21 0140 9     Pain Loc --      Pain Edu? --      Excl. in GC? --    CONSTITUTIONAL: Alert and oriented x 3 and responds appropriately to questions. Well-appearing; well-nourished; GCS 15 HEAD: Normocephalic; atraumatic EYES: Conjunctivae clear, PERRL, EOMI ENT: normal nose; no rhinorrhea; moist mucous membranes; pharynx without lesions noted; no dental injury; no septal hematoma, no injury to the tongue NECK: Supple, no meningismus, no LAD; no midline spinal tenderness, step-off or deformity; trachea midline CARD: Regular and tachycardic; S1 and S2 appreciated; no murmurs, no clicks, no rubs, no gallops RESP: Normal chest excursion without splinting or tachypnea; breath sounds clear and equal bilaterally; no wheezes, no rhonchi, no rales; no hypoxia or respiratory distress CHEST:  chest wall stable, no crepitus or ecchymosis or deformity, nontender to palpation; no flail chest ABD/GI: Normal bowel sounds; non-distended; soft, non-tender, no rebound, no guarding; no ecchymosis or other lesions noted PELVIS:  stable, nontender  to palpation BACK:  The back appears normal and is non-tender to palpation, there is no CVA tenderness; no midline spinal tenderness, step-off or deformity EXT: Normal ROM in all joints; non-tender to palpation; no edema; normal capillary refill; no cyanosis, no bony tenderness or bony deformity of patient's extremities, no joint effusion, compartments are soft, extremities are warm and well-perfused, no ecchymosis SKIN: Normal color for age and race; warm NEURO: Moves all extremities equally normal sensation diffusely, cranial nerves II through XII intact, normal speech PSYCH: The patient's mood and manner are appropriate. Grooming and personal hygiene are appropriate.  ____________________________________________   LABS (all labs ordered are listed, but only abnormal results are displayed)  Labs Reviewed  CBC WITH DIFFERENTIAL/PLATELET - Abnormal; Notable for the following components:  Result Value   RBC 4.14 (*)    Hemoglobin 12.7 (*)    HCT 36.6 (*)    Platelets 146 (*)    All other components within normal limits  URINALYSIS, ROUTINE W REFLEX MICROSCOPIC - Abnormal; Notable for the following components:   Ketones, ur TRACE (*)    Protein, ur 30 (*)    All other components within normal limits  URINE DRUG SCREEN, QUALITATIVE ([facility] ONLY) - Abnormal; Notable for the following components:   Tricyclic, Ur Screen POSITIVE (*)    Opiate, Ur Screen POSITIVE (*)    Benzodiazepine, Ur Scrn POSITIVE (*)    All other components within normal limits  COMPREHENSIVE METABOLIC PANEL  ETHANOL  CBG MONITORING, ED   ____________________________________________  EKG   EKG Interpretation  Date/Time:  Sunday January 25 2021 02:15:30 EST Ventricular Rate:  101 PR Interval:    QRS Duration: 112 QT Interval:  331 QTC Calculation: 429 R Axis:   17 Text Interpretation: Sinus tachycardia Borderline intraventricular conduction delay No significant change since last tracing Confirmed by  Rochele Raring [phone]) on 01/25/2021 2:27:07 AM       ____________________________________________  RADIOLOGY Normajean Baxter Jrake Rodriquez, personally viewed and evaluated these images (plain radiographs) as part of my medical decision making, as well as reviewing the written report by the radiologist.  ED MD interpretation:  none  Official radiology report(s): No results found.  ____________________________________________   PROCEDURES  Procedure(s) performed (including Critical Care):   ____________________________________________   INITIAL IMPRESSION / ASSESSMENT AND PLAN / ED COURSE  As part of my medical decision making, I reviewed the following data within the electronic MEDICAL RECORD NUMBER Nursing notes reviewed and incorporated, Labs reviewed, EKG interpreted tachycardia, Old chart reviewed, Notes from prior ED visits and [place] Controlled Substance Database         Patient here with history of seizures since October 2021 with 5 seizures over the past 2 days.  Questionable medication compliance over the past couple of days as he states he flushed his medications down the toilet.  Will load with IV Keppra here.  Will check labs, urine to ensure no sign of electrolyte derangement, anemia, dehydration, infection that could be causing his symptoms.  He states he is feeling sore all over but has no signs of focal trauma on exam.  Have offered Tylenol, ibuprofen which he declines.  Currently neurologically intact.    4:49 AM  Pt's labs are unremarkable.  Urine is positive for opiates, tricyclics and benzodiazepines.  It appears he is prescribed Elavil and Suboxone.  It does not appear per the [facility] controlled substance reporting system that he receives benzodiazepines.  I wonder if benzodiazepine withdrawal could be contributing to his recurrent seizures.  Patient is asking to be discharged home stating that his brother is waiting to take him.  He states that he does not want to be admitted  to the hospital.  Have recommended that we discuss his case with neurology and we have discussed that they may recommend admission to the hospital given reports of 5 seizures in the past 48 hours.  He states he does not want to be admitted to the hospital and plans to follow-up with his neurologist as an outpatient and already has a 72-hour EEG scheduled.  He has not had any seizure-like activity here and is neurologically intact.  We have discussed risk and benefits of leaving without further discussion with neurology and possible admission for observation.  He understands these risks  and are able to verbalize them back to me.  Understands that he could continue to have seizures and this could be life-threatening to him if they are not well controlled.  He is requesting a refill of his Keppra which has been sent to his pharmacy.  Also requesting 3 days of his Lyrica.  This is also been sent to his pharmacy.  Have encouraged him to stay multiple times which he refuses.  He does not appear intoxicated and appears to have capacity to make decisions for himself.  He will sign out AGAINST MEDICAL ADVICE.  Discussed return precautions.  Patient verbalized understanding.  I reviewed all nursing notes and pertinent previous records as available.  I have reviewed and interpreted any EKGs, lab and urine results, imaging (as available).   ____________________________________________   FINAL CLINICAL IMPRESSION(S) / ED DIAGNOSES  Final diagnoses:  Seizure [facility])     ED Discharge Orders         Ordered    levETIRAcetam (KEPPRA) 500 MG tablet  2 times daily        01/25/21 0448    pregabalin (LYRICA) 150 MG capsule  3 times daily        01/25/21 0454    pregabalin (LYRICA) 50 MG capsule  Daily at bedtime        01/25/21 0454          *Please note:  ROBBIE RIDEAUX was evaluated in Emergency Department on 01/25/2021 for the symptoms described in the history of present illness. He was evaluated in the  context of the global COVID-19 pandemic, which necessitated consideration that the patient might be at risk for infection with the SARS-CoV-2 virus that causes COVID-19. Institutional protocols and algorithms that pertain to the evaluation of patients at risk for COVID-19 are in a state of rapid change based on information released by regulatory bodies including the CDC and federal and state organizations. These policies and algorithms were followed during the patient's care in the ED.  Some ED evaluations and interventions may be delayed as a result of limited staffing during and the pandemic.*   Note:  This document was prepared using Dragon voice recognition software and may include unintentional dictation errors.   Abdirizak Richison, Layla Maw, DO 01/25/21 [phone]

## 2021-02-12 ENCOUNTER — Emergency Department: Payer: Medicare Other

## 2021-02-12 ENCOUNTER — Emergency Department
Admission: EM | Admit: 2021-02-12 | Discharge: 2021-02-12 | Disposition: A | Discharge status: Home / Self Care | Payer: Medicare Other | Attending: Emergency Medicine | Admitting: Emergency Medicine

## 2021-02-12 ENCOUNTER — Encounter: Payer: Self-pay | Admitting: Emergency Medicine

## 2021-02-12 ENCOUNTER — Other Ambulatory Visit: Payer: Self-pay

## 2021-02-12 DIAGNOSIS — Z79899 Other long term (current) drug therapy: Secondary | ICD-10-CM | POA: Insufficient documentation

## 2021-02-12 DIAGNOSIS — F17210 Nicotine dependence, cigarettes, uncomplicated: Secondary | ICD-10-CM | POA: Insufficient documentation

## 2021-02-12 DIAGNOSIS — M25572 Pain in left ankle and joints of left foot: Secondary | ICD-10-CM

## 2021-02-12 DIAGNOSIS — M25472 Effusion, left ankle: Secondary | ICD-10-CM | POA: Insufficient documentation

## 2021-02-12 DIAGNOSIS — I10 Essential (primary) hypertension: Secondary | ICD-10-CM | POA: Diagnosis not present

## 2021-02-12 LAB — SYNOVIAL CELL COUNT + DIFF, W/ CRYSTALS
Crystals, Fluid: NONE SEEN
Eosinophils-Synovial: 0 %
Lymphocytes-Synovial Fld: 4 %
Monocyte-Macrophage-Synovial Fluid: 2 %
Neutrophil, Synovial: 94 %

## 2021-02-12 LAB — BASIC METABOLIC PANEL
Anion gap: 9 (ref 5–15)
BUN: 14 mg/dL (ref 6–20)
CO2: 23 mmol/L (ref 22–32)
Calcium: 8.7 mg/dL — ABNORMAL LOW (ref 8.9–10.3)
Chloride: 103 mmol/L (ref 98–111)
Creatinine, Ser: 0.73 mg/dL (ref 0.61–1.24)
GFR, Estimated: >60 mL/min (ref >60)
Glucose, Bld: 111 mg/dL — ABNORMAL HIGH (ref 70–99)
Potassium: 3.3 mmol/L — ABNORMAL LOW (ref 3.5–5.1)
Sodium: 135 mmol/L (ref 135–145)

## 2021-02-12 LAB — CBC WITH DIFFERENTIAL/PLATELET
Abs Immature Granulocytes: 0.04 10*3/uL (ref 0.00–0.07)
Basophils Absolute: 0 10*3/uL (ref 0.0–0.1)
Basophils Relative: 0 %
Eosinophils Absolute: 0.1 10*3/uL (ref 0.0–0.5)
Eosinophils Relative: 1 %
HCT: 36.9 % — ABNORMAL LOW (ref 39.0–52.0)
Hemoglobin: 12.4 g/dL — ABNORMAL LOW (ref 13.0–17.0)
Immature Granulocytes: 1 %
Lymphocytes Relative: 20 %
Lymphs Abs: 1.8 10*3/uL (ref 0.7–4.0)
MCH: 29.8 pg (ref 26.0–34.0)
MCHC: 33.6 g/dL (ref 30.0–36.0)
MCV: 88.7 fL (ref 80.0–100.0)
Monocytes Absolute: 0.7 10*3/uL (ref 0.1–1.0)
Monocytes Relative: 7 %
Neutro Abs: 6.2 10*3/uL (ref 1.7–7.7)
Neutrophils Relative %: 71 %
Platelets: 222 10*3/uL (ref 150–400)
RBC: 4.16 MIL/uL — ABNORMAL LOW (ref 4.22–5.81)
RDW: 13.3 % (ref 11.5–15.5)
WBC: 8.8 10*3/uL (ref 4.0–10.5)
nRBC: 0 % (ref 0.0–0.2)

## 2021-02-12 LAB — SEDIMENTATION RATE: Sed Rate: 57 mm/hr — ABNORMAL HIGH (ref 0–15)

## 2021-02-12 LAB — LACTIC ACID, PLASMA: Lactic Acid, Venous: 1.6 mmol/L (ref 0.5–1.9)

## 2021-02-12 MED ORDER — ACETAMINOPHEN 500 MG PO TABS
1000.0000 mg | ORAL_TABLET | Freq: Once | ORAL | Status: AC
  Administered 2021-02-12: 1000 mg via ORAL
  Filled 2021-02-12: qty 2

## 2021-02-12 MED ORDER — LIDOCAINE HCL (PF) 1 % IJ SOLN
5.0000 mL | Freq: Once | INTRAMUSCULAR | Status: AC
  Administered 2021-02-12: 5 mL
  Filled 2021-02-12: qty 5

## 2021-02-12 MED ORDER — NAPROXEN 500 MG PO TABS
500.0000 mg | ORAL_TABLET | Freq: Once | ORAL | Status: AC
  Administered 2021-02-12: 500 mg via ORAL
  Filled 2021-02-12: qty 1

## 2021-02-12 NOTE — ED Notes (Signed)
Pt stating that he needs to leave in about 10 minutes--updated EDP.

## 2021-02-12 NOTE — ED Triage Notes (Signed)
Pt to ED from home c/o left foot and ankle pain x1 week, unsure if injured foot.  States has hx of seizures and may have fallen sometimes.  Pt states hx of psoriasis and feels like he's having a flair up on his feet.

## 2021-02-12 NOTE — ED Notes (Signed)
Pt given warm blanket, pillow, and tv remote.

## 2021-02-12 NOTE — ED Notes (Signed)
Pt states that he had to leave at this time, was late for his appt. Instructed to followup with PCP and return to ED for further needs. Unable to stay for discharge instructions or e-signature. "I feel a million times better". Left ambulatory. Eats and drinks without difficulty.

## 2021-02-12 NOTE — ED Provider Notes (Signed)
Procedures  Clinical Course as of 02/12/21 0819  Thu Feb 12, 2021  [id number] Spoke with patient's wife, Lelon Mast, over the phone and she urged patient to undergo left ankle arthrocentesis. Patient is agreeable for work-up of septic arthritis. [DS]  1460 Arthrocentesis complete.  Pulled about 5 cc of serosanguineous fluid.  Not grossly purulent.  Well-tolerated. [DS]    Clinical Course User Index [DS] Delton Prairie, MD    ----------------------------------------- 8:19 AM on 02/12/2021 ----------------------------------------- While waiting for arthrocentesis lab results, patient eloped.  Gram stain was negative, cell count and crystal analysis not yet available.    Sharman Cheek, MD 02/12/21 [phone]

## 2021-02-12 NOTE — ED Notes (Signed)
This RN to bedside to assist with synovial fluid collection by MD on left ankle. Area numbed with lidocaine infiltration by MD. Fluid collected. Pt tolerated procedure well. Specimen labeled and walked to lab by this RN. Specimen handed to lab tech in lab.

## 2021-02-12 NOTE — ED Provider Notes (Signed)
[facility] Emergency Department Provider Note ____________________________________________   Event Date/Time   First MD Initiated Contact with Patient 02/12/21 0518     (approximate)  I have reviewed the triage vital signs and the nursing notes.  HISTORY  Chief Complaint Leg Pain and Foot Pain   HPI Nicholas Delgado is a 40 y.o. malewho presents to the ED for evaluation of foot and leg pain.  Chart review indicates history of HTN, HLD, seizures. History of psoriatic arthritis following with rheumatology.  Prescribed MTX and prednisone chronically.  Patient presents to the ED with acute on chronic left ankle pain. Patient denies any falls, trauma or injuries. He reports that for the past 2 days his left ankle has been significantly more swollen and painful than normal. He reports he often walks with a limp, that he attributes to his fairly severe psoriatic arthritis, but he reports the past 2 days have been different and worse than normal. He denies any fevers or chills, emesis, syncope.   Past Medical History:  Diagnosis Date  . Hypertension   . Seizures [facility])     Patient Active Problem List   Diagnosis Date Noted  . Seizure (HCC) 11/24/2020  . Tobacco use disorder 08/25/2015  . Opioid use with withdrawal (HCC) 08/25/2015  . 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 HTN (hypertension) 08/25/2015  . Dyslipidemia 08/25/2015  . Opioid use disorder, severe, dependence (HCC) 08/24/2015    Past Surgical History:  Procedure Laterality Date  . arm surgery Left     Prior to Admission medications   Medication Sig Start Date End Date Taking? Authorizing Provider  amitriptyline (ELAVIL) 25 MG tablet Take 25 mg by mouth at bedtime. 10/15/20   [provider]  atorvastatin (LIPITOR) 20 MG tablet Take 20 mg by mouth at bedtime.     [provider]  carvedilol (COREG) 6.25 MG tablet Take 6.25 mg  by mouth 2 (two) times daily with a meal.    [provider]  escitalopram (LEXAPRO) 20 MG tablet Take 20 mg by mouth daily. 06/03/20   [provider]  gabapentin (NEURONTIN) 600 MG tablet Take 600 mg by mouth 3 (three) times daily. 10/13/20   [provider]  levETIRAcetam (KEPPRA) 500 MG tablet Take 1 tablet (500 mg total) by mouth 2 (two) times daily. 01/03/21 02/02/21  Concha Se, MD  levETIRAcetam (KEPPRA) 500 MG tablet Take 1 tablet (500 mg total) by mouth 2 (two) times daily. 01/25/21   Ward, Layla Maw, DO  lisinopril (PRINIVIL,ZESTRIL) 20 MG tablet Take 20 mg by mouth daily.    [provider]  Oxycodone HCl 10 MG TABS Take 10 mg by mouth every 6 (six) hours. 06/25/20   [provider]  pregabalin (LYRICA) 150 MG capsule Take 1 capsule (150 mg total) by mouth 3 (three) times daily. 01/25/21   Ward, Layla Maw, DO  pregabalin (LYRICA) 50 MG capsule Take 1 capsule (50 mg total) by mouth at bedtime. 01/25/21   Ward, Kristen N, DO  SUBOXONE 4-1 MG FILM Place under the tongue. 10/01/20   [provider]  traZODone (DESYREL) 50 MG tablet Take 50-100 mg by mouth at bedtime. 06/03/20   [provider]    Allergies Sulfa antibiotics  History reviewed. No pertinent family history.  Social History Social History   Tobacco Use  . Smoking status: Current Every Day Smoker    Packs/day: 2.00  . Smokeless tobacco: Never Used  Substance Use Topics  . Alcohol use: No  . Drug use: Not Currently    Comment: percocet 10mg     Review of Systems  Constitutional: No fever/chills Eyes: No visual changes. ENT: No sore throat. Cardiovascular: Denies chest pain. Respiratory: Denies shortness of breath. Gastrointestinal: No abdominal pain.  No nausea, no vomiting.  No diarrhea.  No constipation. Genitourinary: Negative for dysuria. Musculoskeletal: Negative for back pain. Positive for left ankle pain and swelling. Skin: Negative for  rash. Neurological: Negative for headaches, focal weakness or numbness.  ____________________________________________   PHYSICAL EXAM:  VITAL SIGNS: Vitals:   02/12/21 0438  BP: (!) 154/105  Pulse: (!) 113  Resp: 16  Temp: 98.7 F (37.1 C)  SpO2: 98%     Constitutional: Alert and oriented. Well appearing and in no acute distress. Eyes: Conjunctivae are normal. PERRL. EOMI. Head: Atraumatic. Nose: No congestion/rhinnorhea. Mouth/Throat: Mucous membranes are moist.  Oropharynx non-erythematous. Neck: No stridor. No cervical spine tenderness to palpation. Cardiovascular: Normal rate, regular rhythm. Grossly normal heart sounds.  Good peripheral circulation. Respiratory: Normal respiratory effort.  No retractions. Lungs CTAB. Gastrointestinal: Soft , nondistended, nontender to palpation. No CVA tenderness. Musculoskeletal: Obvious effusion to the left ankle joint without overlying skin changes or signs of trauma. No signs of cellulitis. Left ankle tender to palpation diffusely and pain with passive ROM. Neurologic:  Normal speech and language. No gross focal neurologic deficits are appreciated.  Skin:  Skin is warm, dry and intact. No rash noted. Psychiatric: Mood and affect are normal. Speech and behavior are normal.  ____________________________________________   LABS (all labs ordered are listed, but only abnormal results are displayed)  Labs Reviewed  CBC WITH DIFFERENTIAL/PLATELET - Abnormal; Notable for the following components:      Result Value   RBC 4.16 (*)    Hemoglobin 12.4 (*)    HCT 36.9 (*)    All other components within normal limits  BODY FLUID CULTURE  BODY FLUID CULTURE W GRAM STAIN  BASIC METABOLIC PANEL  SEDIMENTATION RATE  LACTIC ACID, PLASMA  SYNOVIAL CELL COUNT + DIFF, W/ CRYSTALS    ____________________________________________  RADIOLOGY  ED MD interpretation: Plain films of the left ankle and foot reviewed by me without evidence of acute  bony injury. Left ankle joint effusion noted.  Official radiology report(s): DG Ankle Complete Left  Result Date: 02/12/2021 CLINICAL DATA:  40 year old male with pain for 1 week. History of seizures, unknown injury. History of psoriatic arthritis. EXAM: LEFT ANKLE COMPLETE - 3+ VIEW COMPARISON:  Left ankle series 12/04/2019.  Left foot series today. FINDINGS: Bone mineralization is within normal limits. Chronic ossific fragments about the medial malleolus (stable) and lateral malleolus (new since 2020). Stable mortise joint alignment. Talar dome remains intact. There is chronic spurring at the anterior talus which has mildly progressed. Overlying anterior ankle soft tissue swelling and evidence of joint effusion which is similar to the prior study. No acute osseous abnormality identified. IMPRESSION: 1. Chronic or recurrent left ankle joint effusion, soft tissue swelling. 2. Mild progression since 2020 of chronic posttraumatic and degenerative osseous changes at the ankle. No acute osseous abnormality identified. Electronically Signed   By: Odessa FlemingH  Hall M.D.   On: 02/12/2021 05:09   DG Foot Complete Left  Result Date: 02/12/2021 CLINICAL DATA:  40 year old male with pain for 1 week. History of seizures, unknown injury. History of psoriatic arthritis. EXAM: LEFT FOOT - COMPLETE 3+ VIEW COMPARISON:  Ankle series 12/04/2019. FINDINGS: Bone mineralization is within normal limits.  There is no evidence of fracture or dislocation. Mild 1st MTP joint space loss and subchondral sclerosis. Elsewhere joint spaces and alignment appear preserved. There is no other arthropathy or focal bone abnormality. Ankle joint effusion and/or soft tissue swelling is noted, similar to the 2020 ankle series. No other soft tissue abnormality. IMPRESSION: 1. No acute osseous abnormality identified in the left foot. 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 2. Chronic or recurrent left ankle joint effusion and/or soft tissue swelling. Electronically  Signed   By: Odessa Fleming M.D.   On: 02/12/2021 05:07    ____________________________________________   PROCEDURES and INTERVENTIONS  Procedure(s) performed (including Critical Care):  .1-3 Lead EKG Interpretation Performed by: Delton Prairie, MD Authorized by: Delton Prairie, MD     Interpretation: abnormal     ECG rate:  110   ECG rate assessment: tachycardic     Rhythm: sinus tachycardia     Ectopy: none     Conduction: normal   .Joint Aspiration/Arthrocentesis  Date/Time: 02/12/2021 6:50 AM Performed by: Delton Prairie, MD Authorized by: Delton Prairie, MD   Consent:    Consent obtained:  Verbal   Consent given by:  Patient and spouse   Risks, benefits, and alternatives were discussed: yes     Risks discussed:  Bleeding, infection, pain and incomplete drainage Location:    Location:  Ankle   Ankle:  L ankle Anesthesia:    Anesthesia method:  Local infiltration   Local anesthetic:  Lidocaine 1% w/o epi Procedure details:    Ultrasound guidance: no     Approach:  Lateral   Aspirate amount:  5cc   Aspirate characteristics:  Serous and blood-tinged   Steroid injected: no     Specimen collected: yes   Post-procedure details:    Dressing:  Adhesive bandage   Procedure completion:  Tolerated well, no immediate complications    Medications  lidocaine (PF) (XYLOCAINE) 1 % injection 5 mL (5 mLs Infiltration Given by Other 02/12/21 0600)  acetaminophen (TYLENOL) tablet 1,000 mg (1,000 mg Oral Given 02/12/21 0553)  naproxen (NAPROSYN) tablet 500 mg (500 mg Oral Given 02/12/21 0553)    ____________________________________________   MDM / ED COURSE  40 year old male with known psoriatic arthritis and middle compromised presents to the ED with acute on chronic left ankle pain and swelling, necessitating septic arthritis rule out.  Patient tachycardic on arrival, otherwise hemodynamically stable on room air.  Exam demonstrates an obvious effusion to his left ankle without overlying  signs of trauma, cellulitis or open injury.  Warm to the touch and pain with passive ROM, concern for the possibility of septic arthritis.  Arthrocentesis performed, as above, to this joint.  Patient will be signed out to oncoming provider to follow-up on the arthrocentesis studies as well as blood work, primarily to assess for septic arthritis versus gout versus other arthritis.  Disposition pending remainder of work-up  Clinical Course as of 02/12/21 9381  Thu Feb 12, 2021  8299 Spoke with patient's wife, Lelon Mast, over the phone and she urged patient to undergo left ankle arthrocentesis. Patient is agreeable for work-up of septic arthritis. [DS]  3716 Arthrocentesis complete.  Pulled about 5 cc of serosanguineous fluid.  Not grossly purulent.  Well-tolerated. [DS]    Clinical Course User Index [DS] Delton Prairie, MD    ____________________________________________   FINAL CLINICAL IMPRESSION(S) / ED DIAGNOSES  Final diagnoses:  Acute left ankle pain  Left ankle effusion     ED Discharge Orders    None  Delton Prairie   Note:  This document was prepared using Conservation officer, historic buildings and may include unintentional dictation errors.   Delton Prairie, MD 02/12/21 [phone]

## 2021-02-12 NOTE — ED Notes (Signed)
Pt given graham crackers and coffee per request.

## 2021-02-15 LAB — BODY FLUID CULTURE W GRAM STAIN
Culture: NO GROWTH
Gram Stain: NONE SEEN

## 2021-02-22 ENCOUNTER — Other Ambulatory Visit: Payer: Self-pay

## 2021-02-22 ENCOUNTER — Emergency Department
Admission: EM | Admit: 2021-02-22 | Discharge: 2021-02-22 | Disposition: A | Discharge status: Home / Self Care | Payer: Medicare Other | Attending: Emergency Medicine | Admitting: Emergency Medicine

## 2021-02-22 DIAGNOSIS — I10 Essential (primary) hypertension: Secondary | ICD-10-CM | POA: Insufficient documentation

## 2021-02-22 DIAGNOSIS — R569 Unspecified convulsions: Secondary | ICD-10-CM | POA: Insufficient documentation

## 2021-02-22 DIAGNOSIS — Z8669 Personal history of other diseases of the nervous system and sense organs: Secondary | ICD-10-CM | POA: Insufficient documentation

## 2021-02-22 DIAGNOSIS — F17200 Nicotine dependence, unspecified, uncomplicated: Secondary | ICD-10-CM | POA: Diagnosis not present

## 2021-02-22 DIAGNOSIS — Z79899 Other long term (current) drug therapy: Secondary | ICD-10-CM | POA: Diagnosis not present

## 2021-02-22 DIAGNOSIS — R4182 Altered mental status, unspecified: Secondary | ICD-10-CM | POA: Diagnosis not present

## 2021-02-22 LAB — CBC WITH DIFFERENTIAL/PLATELET
Abs Immature Granulocytes: 0.05 10*3/uL (ref 0.00–0.07)
Basophils Absolute: 0 10*3/uL (ref 0.0–0.1)
Basophils Relative: 0 %
Eosinophils Absolute: 0 10*3/uL (ref 0.0–0.5)
Eosinophils Relative: 0 %
HCT: 43.1 % (ref 39.0–52.0)
Hemoglobin: 14.8 g/dL (ref 13.0–17.0)
Immature Granulocytes: 0 %
Lymphocytes Relative: 6 %
Lymphs Abs: 0.7 10*3/uL (ref 0.7–4.0)
MCH: 29.7 pg (ref 26.0–34.0)
MCHC: 34.3 g/dL (ref 30.0–36.0)
MCV: 86.5 fL (ref 80.0–100.0)
Monocytes Absolute: 0.3 10*3/uL (ref 0.1–1.0)
Monocytes Relative: 2 %
Neutro Abs: 11.1 10*3/uL — ABNORMAL HIGH (ref 1.7–7.7)
Neutrophils Relative %: 92 %
Platelets: 268 10*3/uL (ref 150–400)
RBC: 4.98 MIL/uL (ref 4.22–5.81)
RDW: 13.1 % (ref 11.5–15.5)
WBC: 12.2 10*3/uL — ABNORMAL HIGH (ref 4.0–10.5)
nRBC: 0 % (ref 0.0–0.2)

## 2021-02-22 LAB — URINE DRUG SCREEN, QUALITATIVE (ARMC ONLY)
Amphetamines, Ur Screen: NOT DETECTED
Barbiturates, Ur Screen: NOT DETECTED
Benzodiazepine, Ur Scrn: POSITIVE — AB
Cannabinoid 50 Ng, Ur ~~LOC~~: NOT DETECTED
Cocaine Metabolite,Ur ~~LOC~~: NOT DETECTED
MDMA (Ecstasy)Ur Screen: NOT DETECTED
Methadone Scn, Ur: NOT DETECTED
Opiate, Ur Screen: POSITIVE — AB
Phencyclidine (PCP) Ur S: NOT DETECTED
Tricyclic, Ur Screen: POSITIVE — AB

## 2021-02-22 LAB — BASIC METABOLIC PANEL
Anion gap: 10 (ref 5–15)
BUN: 8 mg/dL (ref 6–20)
CO2: 21 mmol/L — ABNORMAL LOW (ref 22–32)
Calcium: 8.9 mg/dL (ref 8.9–10.3)
Chloride: 108 mmol/L (ref 98–111)
Creatinine, Ser: 0.78 mg/dL (ref 0.61–1.24)
GFR, Estimated: >60 mL/min (ref >60)
Glucose, Bld: 162 mg/dL — ABNORMAL HIGH (ref 70–99)
Potassium: 3.5 mmol/L (ref 3.5–5.1)
Sodium: 139 mmol/L (ref 135–145)

## 2021-02-22 LAB — ETHANOL: Alcohol, Ethyl (B): <10 mg/dL (ref <10)

## 2021-02-22 MED ORDER — LEVETIRACETAM IN NACL 1000 MG/100ML IV SOLN
1000.0000 mg | Freq: Once | INTRAVENOUS | Status: AC
  Administered 2021-02-22: 1000 mg via INTRAVENOUS
  Filled 2021-02-22: qty 100

## 2021-02-22 MED ORDER — LORAZEPAM 2 MG/ML IJ SOLN
2.0000 mg | Freq: Once | INTRAMUSCULAR | Status: AC
  Administered 2021-02-22: 2 mg via INTRAVENOUS
  Filled 2021-02-22: qty 1

## 2021-02-22 MED ORDER — SODIUM CHLORIDE 0.9 % IV SOLN: Freq: Once | INTRAVENOUS | Status: AC

## 2021-02-22 NOTE — ED Triage Notes (Signed)
PER [facility], CALLED TO PT'S HOME D/T PT HAVING ALTERED MENTAL STATUS. HAS HX OF SEIZURES, MOTHER STATES TO EMS THAT PT HAS HAD SEIZURES "ALL DAY, LASTING OVER AN HOUR". PT BG 191. PT PRESENTS NOT FOLLOWING COMMANDS, WHEN ASKED IF HE KNOWS THE MONTH AND/OR DAY HE SHAKES HIS HEAD "NO".

## 2021-02-22 NOTE — ED Provider Notes (Signed)
[facility] Emergency Department Provider Note   ____________________________________________   I have reviewed the triage vital signs and the nursing notes.   HISTORY  Chief Complaint Altered Mental Status   History limited by and level 5 caveat due to: Post ictal state  HPI Nicholas Delgado is a 40 y.o. male who presents to the emergency department today after experiencing multiple seizures today.  Unfortunately the patient is postictal and unable to give any history.  Wife did come to the room and I was able to get some history from the wife.  She states that he appears to be acting like he normally does after seizures.  She is not sure if he has been taking his medication recently since he has been living with his mom over the past few days.  Wife states she did talk to him last night and he was asking her questions about his medication regimen which makes her concerned he has not been taking his medications.  No reported recent illness.  Records reviewed. Per medical record review patient has a history of seizures.  Past Medical History:  Diagnosis Date  . Hypertension   . Seizures [facility])     Patient Active Problem List   Diagnosis Date Noted  . Seizure (HCC) 11/24/2020  . Tobacco use disorder 08/25/2015  . Opioid use with withdrawal (HCC) 08/25/2015  . 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 HTN (hypertension) 08/25/2015  . Dyslipidemia 08/25/2015  . Opioid use disorder, severe, dependence (HCC) 08/24/2015    Past Surgical History:  Procedure Laterality Date  . arm surgery Left     Prior to Admission medications   Medication Sig Start Date End Date Taking? Authorizing Provider  amitriptyline (ELAVIL) 25 MG tablet Take 25 mg by mouth at bedtime. 10/15/20   [provider]  atorvastatin (LIPITOR) 20 MG tablet Take 20 mg by mouth at bedtime.     [provider]  carvedilol (COREG)  6.25 MG tablet Take 6.25 mg by mouth 2 (two) times daily with a meal.    [provider]  escitalopram (LEXAPRO) 20 MG tablet Take 20 mg by mouth daily. 06/03/20   [provider]  gabapentin (NEURONTIN) 600 MG tablet Take 600 mg by mouth 3 (three) times daily. 10/13/20   [provider]  levETIRAcetam (KEPPRA) 500 MG tablet Take 1 tablet (500 mg total) by mouth 2 (two) times daily. 01/03/21 02/02/21  Concha Se, MD  levETIRAcetam (KEPPRA) 500 MG tablet Take 1 tablet (500 mg total) by mouth 2 (two) times daily. 01/25/21   Ward, Layla Maw, DO  lisinopril (PRINIVIL,ZESTRIL) 20 MG tablet Take 20 mg by mouth daily.    [provider]  Oxycodone HCl 10 MG TABS Take 10 mg by mouth every 6 (six) hours. 06/25/20   [provider]  pregabalin (LYRICA) 150 MG capsule Take 1 capsule (150 mg total) by mouth 3 (three) times daily. 01/25/21   Ward, Layla Maw, DO  pregabalin (LYRICA) 50 MG capsule Take 1 capsule (50 mg total) by mouth at bedtime. 01/25/21   Ward, Kristen N, DO  SUBOXONE 4-1 MG FILM Place under the tongue. 10/01/20   [provider]  traZODone (DESYREL) 50 MG tablet Take 50-100 mg by mouth at bedtime. 06/03/20   [provider]    Allergies Sulfa antibiotics  History reviewed. No pertinent family history.  Social History Social History   Tobacco Use  . Smoking status: Current  Every Day Smoker    Packs/day: 2.00  . Smokeless tobacco: Never Used  Substance Use Topics  . Alcohol use: No  . Drug use: Not Currently    Comment: percocet 10mg     Review of Systems Unable to obtain secondary to AMS ____________________________________________   PHYSICAL EXAM:  VITAL SIGNS: ED Triage Vitals  Enc Vitals Group     BP 02/22/21 1401 (!) 151/99     Pulse Rate 02/22/21 1401 98     Resp 02/22/21 1401 18     Temp 02/22/21 1401 98.7 F (37.1 C)     Temp src --      SpO2 02/22/21 1401 90 %     Weight 02/22/21 1402 240 lb 1.3 oz (108.9  kg)     Height --      Head Circumference --      Peak Flow --      Pain Score 02/22/21 1402 0   Constitutional: Awake and alert. Eyes: Conjunctivae are normal.  ENT      Head: Normocephalic and atraumatic.      Nose: No congestion/rhinnorhea.      Mouth/Throat: Mucous membranes are moist.      Neck: No stridor. Hematological/Lymphatic/Immunilogical: No cervical lymphadenopathy. Cardiovascular: Normal rate, regular rhythm.  No murmurs, rubs, or gallops.  Respiratory: Normal respiratory effort without tachypnea nor retractions. Breath sounds are clear and equal bilaterally. No wheezes/rales/rhonchi. Gastrointestinal: Soft and non tender. No rebound. No guarding.  Genitourinary: Deferred Musculoskeletal: Normal range of motion in all extremities. No lower extremity edema. Neurologic:  Awake and alert. Only saying yes/no to some questions. Appears to be moving all extremities. Occasional isolated muscle jerk.  Skin:  Skin is warm, dry and intact. No rash noted.  ____________________________________________    LABS (pertinent positives/negatives)  Ethanol <10 BMP wnl except co2 21, glu 162 CBC wbc 12.2, hgb 14.8, plt 268  ____________________________________________   EKG  None  ____________________________________________    RADIOLOGY  None  ____________________________________________   PROCEDURES  Procedures  ____________________________________________   INITIAL IMPRESSION / ASSESSMENT AND PLAN / ED COURSE  Pertinent labs & imaging results that were available during my care of the patient were reviewed by me and considered in my medical decision making (see chart for details).   Patient presented to the emergency department today after suffering a patient with a history of seizures.  Patient with wife it is unclear if patient has been compliant with his seizure medication.  patient's blood work here shows a slight leukocytosis which is consistent with  seizure.  Will plan on giving patient seizure medication for the emergency department.  The patient returned to baseline and is able to give further history about medications versus noncompliance I do think patient would be able to be discharged home.   ____________________________________________   FINAL CLINICAL IMPRESSION(S) / ED DIAGNOSES  Final diagnoses:  Seizure [facility])     Note: This dictation was prepared with Dragon dictation. Any transcriptional errors that result from this process are unintentional     [facility], MD 02/22/21 1501

## 2021-03-05 ENCOUNTER — Inpatient Hospital Stay
Admission: EM | Admit: 2021-03-05 | Discharge: 2021-03-07 | DRG: 101 | Disposition: A | Discharge status: Home / Self Care | Payer: Medicare Other | Attending: Internal Medicine | Admitting: Internal Medicine

## 2021-03-05 ENCOUNTER — Emergency Department: Payer: Medicare Other

## 2021-03-05 ENCOUNTER — Other Ambulatory Visit: Payer: Self-pay

## 2021-03-05 DIAGNOSIS — R569 Unspecified convulsions: Secondary | ICD-10-CM

## 2021-03-05 DIAGNOSIS — Z20822 Contact with and (suspected) exposure to covid-19: Secondary | ICD-10-CM | POA: Diagnosis present

## 2021-03-05 DIAGNOSIS — F17200 Nicotine dependence, unspecified, uncomplicated: Secondary | ICD-10-CM | POA: Diagnosis present

## 2021-03-05 DIAGNOSIS — G40909 Epilepsy, unspecified, not intractable, without status epilepticus: Secondary | ICD-10-CM | POA: Diagnosis not present

## 2021-03-05 DIAGNOSIS — R410 Disorientation, unspecified: Secondary | ICD-10-CM | POA: Diagnosis present

## 2021-03-05 DIAGNOSIS — Z882 Allergy status to sulfonamides status: Secondary | ICD-10-CM

## 2021-03-05 DIAGNOSIS — F32A Depression, unspecified: Secondary | ICD-10-CM | POA: Diagnosis present

## 2021-03-05 DIAGNOSIS — I10 Essential (primary) hypertension: Secondary | ICD-10-CM | POA: Diagnosis not present

## 2021-03-05 DIAGNOSIS — E785 Hyperlipidemia, unspecified: Secondary | ICD-10-CM | POA: Diagnosis present

## 2021-03-05 DIAGNOSIS — Z79899 Other long term (current) drug therapy: Secondary | ICD-10-CM

## 2021-03-05 DIAGNOSIS — F17210 Nicotine dependence, cigarettes, uncomplicated: Secondary | ICD-10-CM | POA: Diagnosis present

## 2021-03-05 LAB — COMPREHENSIVE METABOLIC PANEL
ALT: 23 U/L (ref 0–44)
AST: 31 U/L (ref 15–41)
Albumin: 4.2 g/dL (ref 3.5–5.0)
Alkaline Phosphatase: 107 U/L (ref 38–126)
Anion gap: 15 (ref 5–15)
BUN: 11 mg/dL (ref 6–20)
CO2: 18 mmol/L — ABNORMAL LOW (ref 22–32)
Calcium: 9.1 mg/dL (ref 8.9–10.3)
Chloride: 106 mmol/L (ref 98–111)
Creatinine, Ser: 0.86 mg/dL (ref 0.61–1.24)
GFR, Estimated: >60 mL/min (ref >60)
Glucose, Bld: 141 mg/dL — ABNORMAL HIGH (ref 70–99)
Potassium: 3.6 mmol/L (ref 3.5–5.1)
Sodium: 139 mmol/L (ref 135–145)
Total Bilirubin: 0.6 mg/dL (ref 0.3–1.2)
Total Protein: 7.6 g/dL (ref 6.5–8.1)

## 2021-03-05 LAB — CBC WITH DIFFERENTIAL/PLATELET
Abs Immature Granulocytes: 0.04 10*3/uL (ref 0.00–0.07)
Basophils Absolute: 0 10*3/uL (ref 0.0–0.1)
Basophils Relative: 0 %
Eosinophils Absolute: 0.1 10*3/uL (ref 0.0–0.5)
Eosinophils Relative: 1 %
HCT: 40.9 % (ref 39.0–52.0)
Hemoglobin: 13.6 g/dL (ref 13.0–17.0)
Immature Granulocytes: 0 %
Lymphocytes Relative: 21 %
Lymphs Abs: 2 10*3/uL (ref 0.7–4.0)
MCH: 29.9 pg (ref 26.0–34.0)
MCHC: 33.3 g/dL (ref 30.0–36.0)
MCV: 89.9 fL (ref 80.0–100.0)
Monocytes Absolute: 0.5 10*3/uL (ref 0.1–1.0)
Monocytes Relative: 6 %
Neutro Abs: 6.7 10*3/uL (ref 1.7–7.7)
Neutrophils Relative %: 72 %
Platelets: 175 10*3/uL (ref 150–400)
RBC: 4.55 MIL/uL (ref 4.22–5.81)
RDW: 13.2 % (ref 11.5–15.5)
WBC: 9.3 10*3/uL (ref 4.0–10.5)
nRBC: 0 % (ref 0.0–0.2)

## 2021-03-05 LAB — HIV ANTIBODY (ROUTINE TESTING W REFLEX): HIV Screen 4th Generation wRfx: NONREACTIVE

## 2021-03-05 LAB — URINE DRUG SCREEN, QUALITATIVE (ARMC ONLY)
Amphetamines, Ur Screen: NOT DETECTED
Barbiturates, Ur Screen: NOT DETECTED
Benzodiazepine, Ur Scrn: POSITIVE — AB
Cannabinoid 50 Ng, Ur ~~LOC~~: NOT DETECTED
Cocaine Metabolite,Ur ~~LOC~~: NOT DETECTED
MDMA (Ecstasy)Ur Screen: NOT DETECTED
Methadone Scn, Ur: NOT DETECTED
Opiate, Ur Screen: POSITIVE — AB
Phencyclidine (PCP) Ur S: NOT DETECTED
Tricyclic, Ur Screen: POSITIVE — AB

## 2021-03-05 LAB — SARS CORONAVIRUS 2 (TAT 6-24 HRS): SARS Coronavirus 2: NEGATIVE

## 2021-03-05 MED ORDER — SODIUM CHLORIDE 0.9 % IV SOLN
75.0000 mL/h | INTRAVENOUS | Status: DC
  Administered 2021-03-05 – 2021-03-07 (×3): 75 mL/h via INTRAVENOUS

## 2021-03-05 MED ORDER — LORAZEPAM 2 MG/ML IJ SOLN: 4.0000 mg | INTRAMUSCULAR | Status: AC

## 2021-03-05 MED ORDER — ONDANSETRON HCL 4 MG/2ML IJ SOLN
4.0000 mg | Freq: Four times a day (QID) | INTRAMUSCULAR | Status: DC | PRN
  Administered 2021-03-05: 4 mg via INTRAVENOUS
  Filled 2021-03-05: qty 2

## 2021-03-05 MED ORDER — AMITRIPTYLINE HCL 25 MG PO TABS
25.0000 mg | ORAL_TABLET | Freq: Every day | ORAL | Status: DC
  Administered 2021-03-05 – 2021-03-06 (×2): 25 mg via ORAL
  Filled 2021-03-05 (×3): qty 1

## 2021-03-05 MED ORDER — LORAZEPAM 2 MG/ML IJ SOLN
2.0000 mg | INTRAMUSCULAR | Status: DC | PRN
  Administered 2021-03-05: 2 mg via INTRAVENOUS
  Filled 2021-03-05: qty 1

## 2021-03-05 MED ORDER — LAMOTRIGINE 25 MG PO TABS: 50.0000 mg | ORAL_TABLET | Freq: Two times a day (BID) | ORAL | Status: DC

## 2021-03-05 MED ORDER — LAMOTRIGINE 25 MG PO TABS: 50.0000 mg | ORAL_TABLET | Freq: Every day | ORAL | Status: DC

## 2021-03-05 MED ORDER — ENOXAPARIN SODIUM 40 MG/0.4ML ~~LOC~~ SOLN
40.0000 mg | SUBCUTANEOUS | Status: DC
  Administered 2021-03-05 – 2021-03-06 (×2): 40 mg via SUBCUTANEOUS
  Filled 2021-03-05 (×2): qty 0.4

## 2021-03-05 MED ORDER — DIVALPROEX SODIUM 500 MG PO DR TAB
500.0000 mg | DELAYED_RELEASE_TABLET | Freq: Two times a day (BID) | ORAL | Status: DC
  Administered 2021-03-06 – 2021-03-07 (×3): 500 mg via ORAL
  Filled 2021-03-05 (×4): qty 1

## 2021-03-05 MED ORDER — LAMOTRIGINE 25 MG PO TABS
25.0000 mg | ORAL_TABLET | Freq: Two times a day (BID) | ORAL | Status: DC
  Administered 2021-03-06: 25 mg via ORAL
  Filled 2021-03-05: qty 1

## 2021-03-05 MED ORDER — GABAPENTIN 600 MG PO TABS
600.0000 mg | ORAL_TABLET | Freq: Three times a day (TID) | ORAL | Status: DC
  Administered 2021-03-05 – 2021-03-07 (×5): 600 mg via ORAL
  Filled 2021-03-05 (×5): qty 1

## 2021-03-05 MED ORDER — INFLUENZA VAC SPLIT QUAD 0.5 ML IM SUSY: 0.5000 mL | PREFILLED_SYRINGE | INTRAMUSCULAR | Status: DC

## 2021-03-05 MED ORDER — ONDANSETRON HCL 4 MG PO TABS: 4.0000 mg | ORAL_TABLET | Freq: Four times a day (QID) | ORAL | Status: DC | PRN

## 2021-03-05 MED ORDER — TRAZODONE HCL 50 MG PO TABS
50.0000 mg | ORAL_TABLET | Freq: Every day | ORAL | Status: DC
  Administered 2021-03-05 – 2021-03-06 (×2): 50 mg via ORAL
  Filled 2021-03-05 (×2): qty 1

## 2021-03-05 MED ORDER — ATORVASTATIN CALCIUM 20 MG PO TABS
20.0000 mg | ORAL_TABLET | Freq: Every day | ORAL | Status: DC
  Administered 2021-03-05 – 2021-03-06 (×2): 20 mg via ORAL
  Filled 2021-03-05 (×2): qty 1

## 2021-03-05 MED ORDER — LAMOTRIGINE 25 MG PO TABS: 25.0000 mg | ORAL_TABLET | Freq: Every day | ORAL | Status: DC

## 2021-03-05 MED ORDER — PREGABALIN 50 MG PO CAPS
50.0000 mg | ORAL_CAPSULE | Freq: Every day | ORAL | Status: DC
  Administered 2021-03-06: 50 mg via ORAL
  Filled 2021-03-05 (×2): qty 1

## 2021-03-05 MED ORDER — LEVETIRACETAM IN NACL 1000 MG/100ML IV SOLN
1000.0000 mg | Freq: Once | INTRAVENOUS | Status: AC
  Administered 2021-03-05: 1000 mg via INTRAVENOUS
  Filled 2021-03-05: qty 100

## 2021-03-05 MED ORDER — ACETAMINOPHEN 650 MG RE SUPP: 650.0000 mg | RECTAL | Status: DC | PRN

## 2021-03-05 MED ORDER — ACETAMINOPHEN 325 MG PO TABS
650.0000 mg | ORAL_TABLET | ORAL | Status: DC | PRN
  Administered 2021-03-05: 650 mg via ORAL
  Filled 2021-03-05: qty 2

## 2021-03-05 MED ORDER — BUPRENORPHINE HCL-NALOXONE HCL 8-2 MG SL SUBL: 1.0000 | SUBLINGUAL_TABLET | Freq: Three times a day (TID) | SUBLINGUAL | Status: DC

## 2021-03-05 MED ORDER — PREGABALIN 75 MG PO CAPS
150.0000 mg | ORAL_CAPSULE | Freq: Three times a day (TID) | ORAL | Status: DC
  Administered 2021-03-05 – 2021-03-07 (×4): 150 mg via ORAL
  Filled 2021-03-05 (×5): qty 2

## 2021-03-05 MED ORDER — LAMOTRIGINE 25 MG PO TABS
50.0000 mg | ORAL_TABLET | Freq: Once | ORAL | Status: AC
  Administered 2021-03-05: 50 mg via ORAL
  Filled 2021-03-05: qty 2

## 2021-03-05 MED ORDER — VALPROATE SODIUM 100 MG/ML IV SOLN
3000.0000 mg | Freq: Once | INTRAVENOUS | Status: AC
  Administered 2021-03-05: 3000 mg via INTRAVENOUS
  Filled 2021-03-05: qty 30

## 2021-03-05 MED ORDER — NICOTINE 21 MG/24HR TD PT24
21.0000 mg | MEDICATED_PATCH | Freq: Every day | TRANSDERMAL | Status: DC
  Filled 2021-03-05 (×2): qty 1

## 2021-03-05 MED ORDER — ESCITALOPRAM OXALATE 10 MG PO TABS
20.0000 mg | ORAL_TABLET | Freq: Every day | ORAL | Status: DC
  Administered 2021-03-05 – 2021-03-07 (×3): 20 mg via ORAL
  Filled 2021-03-05 (×3): qty 2

## 2021-03-05 MED ORDER — LISINOPRIL 20 MG PO TABS
20.0000 mg | ORAL_TABLET | Freq: Every day | ORAL | Status: DC
  Administered 2021-03-05 – 2021-03-07 (×3): 20 mg via ORAL
  Filled 2021-03-05 (×2): qty 1
  Filled 2021-03-05: qty 2

## 2021-03-05 MED ORDER — CARVEDILOL 6.25 MG PO TABS
6.2500 mg | ORAL_TABLET | Freq: Two times a day (BID) | ORAL | Status: DC
  Administered 2021-03-06 – 2021-03-07 (×3): 6.25 mg via ORAL
  Filled 2021-03-05 (×3): qty 1

## 2021-03-05 MED ORDER — BUPRENORPHINE HCL-NALOXONE HCL 4-1 MG SL FILM: 1.0000 mg | ORAL_FILM | Freq: Every day | SUBLINGUAL | Status: DC

## 2021-03-05 NOTE — ED Triage Notes (Addendum)
Pt to ED from home via [facility] for seizures. Pt was postictal when EMS arrived to home but was able to walk to stretcher. Pt had another seizure lasting 1/5 min while EMS was pulling into ED bay. Was given 5mg  midazolam IM at 0930 by EMS.  Unknown if pt is compliant with antiseizure meds or if takes antiseizure meds. Pt currently has snoring respirations but maintaining SPO2 at 93% on RA.  EDP and CN at bedside. No IV in place yet.

## 2021-03-05 NOTE — Progress Notes (Signed)
Patient had another seizure while in the ER, he was loaded with IV Keppra and received Lamictal 50 mg in the ER.  Discussed with neurologist, Dr Thomasena Edis who recommends to start patient on Depakote to prevent further episodes of seizures until his Lamictal levels become therapeutic.  Patient will receive a loading dose of Depakote and will start 500 mg twice daily

## 2021-03-05 NOTE — ED Notes (Addendum)
This RN to room to check on patient, monitor alarming. Pt HR noted to be 130s, urinated on himself and drink spilled on floor. Pt appears to be post ictal, is confused and not following commands or answering orientation questions. 2mg  ativan given per [facility]. [facility] at 1639. Seizure pads on bed, no injuries noted to patient. Attending made aware.

## 2021-03-05 NOTE — ED Notes (Signed)
Pt in bed, assisted pt to urinate into urinal. Daughter at bedside. Pt not agitated but is confused and has slurred, incomprehensible speech. Pt maintaining SPO2 at 94-96% on RA. Daughter updated on plan of care. Pharmacy is compounding ordered valproate and will send to ED once ready.

## 2021-03-05 NOTE — ED Notes (Signed)
Pt sleeping, vitals wnl.

## 2021-03-05 NOTE — H&P (Signed)
History and Physical    Nicholas Delgado JME:[id number] DOB: 25-Dec-1980 DOA: 03/05/2021  PCP: Forest Gleason, MD   Patient coming from: Home  I have personally briefly reviewed patient's old medical records in [facility]  Chief Complaint: Seizure  HPI: Nicholas Delgado is a 40 y.o. male with medical history significant for seizure disorder and nicotine dependence who presented to the ER via EMS for evaluation after he had a witnessed seizure episode.  His mother witnessed the seizure and called EMS and when they arrived patient had another seizure admitted to the hospital.  Per EMS it was a tonic-clonic seizure and lasted between 1 to 2 minutes. Patient received 5 mg of midazolam IM.  Upon arrival to the ER he was postictal. During my evaluation patient is awake and alert but is unable to tell me the day of the week or month of the year.  He is oriented only to person and place.  He claims to be compliant with his antiepileptics and said his last dose was 1 day prior to his admission.  He denies alcohol or illicit drug use. He denies having any chest pain, no shortness of breath, no nausea, no headache, no blurred vision, no dizziness, no lightheadedness, no palpitations, no diaphoresis, no leg swelling no fever, no chills, no cough, no dysuria, no nocturia, no hematuria. Labs show sodium 139, potassium 3.6, chloride 106, bicarb 18, glucose 141, BUN 11, creatinine 0.86, calcium 9.1, anion gap 15, alkaline phosphatase 107, albumin 4.2, AST 31, ALT 23, total protein 7.6, white count 9.3, hemoglobin 13.6, hematocrit 40.9, MCV 89.9, RDW 13.2, platelet count 175 His SARS coronavirus 2 point-of-care test is pending Urine drug screen is positive for tricyclic's, opioids and benzodiazepines CT scan of the head without contrast shows no acute abnormality. 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   ED Course: Patient is a 40 year old Caucasian male with a history of seizure disorder who  presents to the ER via EMS for evaluation after he had a witnessed seizure episode at home.  He also had a witnessed seizure with EMS and received 5 mg of Versed in the field.  He was loaded with 1 g of Keppra in the emergency room and also received a dose of Lamictal 50 mg x 1 dose.  He is currently awake and alert but not back to his baseline mental status.  Patient will be referred to observation status for further evaluation  Review of Systems: As per HPI otherwise all other systems reviewed and negative.    Past Medical History:  Diagnosis Date  . Hypertension   . Seizures (HCC)     Past Surgical History:  Procedure Laterality Date  . arm surgery Left      reports that he has been smoking. He has been smoking about 2.00 packs per day. He has never used smokeless tobacco. He reports previous drug use. He reports that he does not drink alcohol.  Allergies  Allergen Reactions  . Sulfa Antibiotics Shortness Of Breath and Anaphylaxis    OTHER REACTION    History reviewed. No pertinent family history.    Prior to Admission medications   Medication Sig Start Date End Date Taking? Authorizing Provider  atorvastatin (LIPITOR) 20 MG tablet Take 20 mg by mouth at bedtime.    Yes [provider]  carvedilol (COREG) 6.25 MG tablet Take 6.25 mg by mouth 2 (two) times daily with a meal.   Yes [provider]  lisinopril (PRINIVIL,ZESTRIL) 20  MG tablet Take 20 mg by mouth daily.   Yes [provider]  amitriptyline (ELAVIL) 25 MG tablet Take 25 mg by mouth at bedtime. 10/15/20   [provider]  escitalopram (LEXAPRO) 20 MG tablet Take 20 mg by mouth daily. 06/03/20   [provider]  gabapentin (NEURONTIN) 600 MG tablet Take 600 mg by mouth 3 (three) times daily. 10/13/20   [provider]  levETIRAcetam (KEPPRA) 500 MG tablet Take 1 tablet (500 mg total) by mouth 2 (two) times daily. 01/03/21 02/02/21  Concha Se, MD  levETIRAcetam  (KEPPRA) 500 MG tablet Take 1 tablet (500 mg total) by mouth 2 (two) times daily. 01/25/21   Ward, Layla Maw, DO  Oxycodone HCl 10 MG TABS Take 10 mg by mouth every 6 (six) hours. 06/25/20   [provider]  pregabalin (LYRICA) 150 MG capsule Take 1 capsule (150 mg total) by mouth 3 (three) times daily. 01/25/21   Ward, Layla Maw, DO  pregabalin (LYRICA) 50 MG capsule Take 1 capsule (50 mg total) by mouth at bedtime. 01/25/21   Ward, Kristen N, DO  SUBOXONE 4-1 MG FILM Place under the tongue. 10/01/20   [provider]  traZODone (DESYREL) 50 MG tablet Take 50-100 mg by mouth at bedtime. 06/03/20   [provider]    Physical Exam: Vitals:   03/05/21 0945 03/05/21 1000 03/05/21 1049 03/05/21 1138  BP:  120/75 (!) 149/88 (!) 145/97  Pulse: 93 91 97 96  Resp: 18 13 17 18   Temp:      SpO2: 92% 96% 100% 100%  Weight:      Height:         Vitals:   03/05/21 0945 03/05/21 1000 03/05/21 1049 03/05/21 1138  BP:  120/75 (!) 149/88 (!) 145/97  Pulse: 93 91 97 96  Resp: 18 13 17 18   Temp:      SpO2: 92% 96% 100% 100%  Weight:      Height:          Constitutional: Alert and oriented x only to person not to place or time. Not in any apparent distress HEENT:      Head: Normocephalic and atraumatic.         Eyes: PERLA, EOMI, Conjunctivae are normal. Sclera is non-icteric.       Mouth/Throat: Mucous membranes are moist.       Neck: Supple with no signs of meningismus. Cardiovascular: Regular rate and rhythm. No murmurs, gallops, or rubs. 2+ symmetrical distal pulses are present . No JVD. No LE edema Respiratory: Respiratory effort normal .Lungs sounds clear bilaterally. No wheezes, crackles, or rhonchi.  Gastrointestinal: Soft, non tender, and non distended with positive bowel sounds.  Genitourinary: No CVA tenderness. Musculoskeletal: Nontender with normal range of motion in all extremities. No cyanosis, or erythema of extremities. Neurologic:  Face is symmetric.  Moving all extremities. No gross focal neurologic deficits  Skin: Skin is warm, dry.  No rash or ulcers Psychiatric: Mood and affect are normal   Labs on Admission: I have personally reviewed following labs and imaging studies  CBC: Recent Labs  Lab 03/05/21 0950  WBC 9.3  NEUTROABS 6.7  HGB 13.6  HCT 40.9  MCV 89.9  PLT 175   Basic Metabolic Panel: Recent Labs  Lab 03/05/21 0950  NA 139  K 3.6  CL 106  CO2 18*  GLUCOSE 141*  BUN 11  CREATININE 0.86  CALCIUM 9.1   GFR: Estimated Creatinine Clearance: 132.7  mL/min (by C-G formula based on SCr of 0.86 mg/dL). Liver Function Tests: Recent Labs  Lab 03/05/21 0950  AST 31  ALT 23  ALKPHOS 107  BILITOT 0.6  PROT 7.6  ALBUMIN 4.2   No results for input(s): LIPASE, AMYLASE in the last 168 hours. No results for input(s): AMMONIA in the last 168 hours. Coagulation Profile: No results for input(s): INR, PROTIME in the last 168 hours. Cardiac Enzymes: No results for input(s): CKTOTAL, CKMB, CKMBINDEX, TROPONINI in the last 168 hours. BNP (last 3 results) No results for input(s): PROBNP in the last 8760 hours. HbA1C: No results for input(s): HGBA1C in the last 72 hours. CBG: No results for input(s): GLUCAP in the last 168 hours. Lipid Profile: No results for input(s): CHOL, HDL, LDLCALC, TRIG, CHOLHDL, LDLDIRECT in the last 72 hours. Thyroid Function Tests: No results for input(s): TSH, T4TOTAL, FREET4, T3FREE, THYROIDAB in the last 72 hours. Anemia Panel: No results for input(s): VITAMINB12, FOLATE, FERRITIN, TIBC, IRON, RETICCTPCT in the last 72 hours. Urine analysis:    Component Value Date/Time   COLORURINE YELLOW 01/25/2021 0205   APPEARANCEUR CLEAR 01/25/2021 0205   APPEARANCEUR Clear 07/28/2014 1806   LABSPEC 1.025 01/25/2021 0205   LABSPEC 1.035 07/28/2014 1806   PHURINE 5.5 01/25/2021 0205   GLUCOSEU NEGATIVE 01/25/2021 0205   GLUCOSEU Negative 07/28/2014 1806   HGBUR NEGATIVE 01/25/2021 0205    BILIRUBINUR NEGATIVE 01/25/2021 0205   BILIRUBINUR Negative 07/28/2014 1806   KETONESUR TRACE (A) 01/25/2021 0205   PROTEINUR 30 (A) 01/25/2021 0205   NITRITE NEGATIVE 01/25/2021 0205   LEUKOCYTESUR NEGATIVE 01/25/2021 0205   LEUKOCYTESUR Negative 07/28/2014 1806    Radiological Exams on Admission: CT Head Wo Contrast  Result Date: 03/05/2021 CLINICAL DATA:  Status post seizure today. EXAM: CT HEAD WITHOUT CONTRAST TECHNIQUE: Contiguous axial images were obtained from the base of the skull through the vertex without intravenous contrast. COMPARISON:  Brain MRI 11/24/2020.  Head CT 01/03/2021. FINDINGS: Brain: No evidence of acute infarction, hemorrhage, hydrocephalus, extra-axial collection or mass lesion/mass effect. Vascular: No hyperdense vessel or unexpected calcification. Skull: Intact.  No focal lesion. 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 Other: None. IMPRESSION: No acute abnormality. 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 Electronically Signed   By: Drusilla Kanner M.D.   On: 03/05/2021 10:55     Assessment/Plan Principal Problem:   Seizures (HCC) Active Problems:   Tobacco use disorder   HTN (hypertension)    Seizure Patient has a history of epilepsy and his Keppra was recently switched to Lamictal by his neurologist It is unclear if patient is compliant with his prescribed medications but he tells me he last took a dose 1 day prior to his admission, he is unable to tell me the name of the medication We will resume Lamictal Place patient on seizure precautions Neurology consult   Hypertension Continue carvedilol and lisinopril   Depression Continue Lexapro and amitriptyline   Nicotine dependence Smoking cessation was discussed with patient in detail Place patient on nicotine transdermal patch 21 mg daily  DVT prophylaxis: Lovenox  Code Status: full code Family Communication: Greater than 50% of time was spent  discussing plan of care with patient at the bedside.  All questions and concerns have been addressed.  He verbalizes understanding and agrees with the plan. Disposition Plan: Back to previous home environment Consults called: Neurology Status: Observation    Dinara Lupu MD [facility]     03/05/2021, 12:35 PM

## 2021-03-05 NOTE — ED Notes (Signed)
Pt awake now. Pt alert and oriented times 4. Pt does not know if takes any anti seizure medications. Pt remembers being in ambulance and that he had seizure, but does not remember anything after that.

## 2021-03-05 NOTE — ED Notes (Signed)
Pt vomited about 500 mL watery stomach contents. Second nurse in room with pt administering medications.

## 2021-03-05 NOTE — Consult Note (Addendum)
Neurology Consult H&P  Nicholas Delgado MR# [id number] 03/05/2021  CC: Breakthrough seizure  History is obtained from: Chart as patient was not comprehensible and also required constant redirecting.  HPI: Nicholas Delgado is a 39 y.o. male PMHx seizure disorder and nicotine dependence who presented to the ER via EMS for evaluation after he had a witnessed seizure episode.  The patient is followed by outpatient neurology and is in the process of transitioning from LEV to LMT. The chart also suggests there is an issue with medication compliance.  The following information taken from admission H&P:  "His mother witnessed the seizure and called EMS and when they arrived patient had another seizure admitted to the hospital.  Per EMS it was a tonic-clonic seizure and lasted between 1 to 2 minutes. Patient received 5 mg of midazolam IM.  Upon arrival to the ER he was postictal. During my evaluation patient is awake and alert but is unable to tell me the day of the week or month of the year.  He is oriented only to person and place.  He claims to be compliant with his antiepileptics and said his last dose was 1 day prior to his admission.  He denies alcohol or illicit drug use. He denies having any chest pain, no shortness of breath, no nausea, no headache, no blurred vision, no dizziness, no lightheadedness, no palpitations, no diaphoresis, no leg swelling no fever, no chills, no cough, no dysuria, no nocturia, no hematuria. Labs show sodium 139, potassium 3.6, chloride 106, bicarb 18, glucose 141, BUN 11, creatinine 0.86, calcium 9.1, anion gap 15, alkaline phosphatase 107, albumin 4.2, AST 31, ALT 23, total protein 7.6, white count 9.3, hemoglobin 13.6, hematocrit 40.9, MCV 89.9, RDW 13.2, platelet count 175"   ROS: Unable to assess due to encephalopathy.  Past Medical History:  Diagnosis Date  . Hypertension   . Seizures (HCC)      History reviewed. No pertinent family history.  Social  History:  reports that he has been smoking. He has been smoking about 2.00 packs per day. He has never used smokeless tobacco. He reports previous drug use. He reports that he does not drink alcohol.   Prior to Admission medications   Medication Sig Start Date End Date Taking? Authorizing Provider  atorvastatin (LIPITOR) 20 MG tablet Take 20 mg by mouth at bedtime.    Yes [provider]  carvedilol (COREG) 6.25 MG tablet Take 6.25 mg by mouth 2 (two) times daily with a meal.   Yes [provider]  folic acid (FOLVITE) 1 MG tablet Take 1 mg by mouth daily. 12/11/20 12/11/21 Yes [provider]  lamoTRIgine (LAMICTAL) 25 MG tablet Take Lamotrigine for seizures (1 tab at night, increase by 1 tab every week until you are taking 6 (six) tabs twice a day). Week 1:  1 tablet each night this week. Week 2:  1 tablet each morning and 1 tablet each night this week. Week 3:  1 tablet each  morning and 2 tablets each night this week. Week 4:  2 tablets each morning and 2 tablets each night this week. Week 5:  2 tablets each morning and 3 tablets each night this week. Week 6:  3 tablets each morning and 3 tablets each night this week. Week 7:  3 tablets each morning and 4 tablets each night this week. Week 8:  4 tablets each morning and 4 tablets each night this week. Week 9:  4 tablets each morning  and 5 tablets each night this week. Week 10: 5 tablets each  morning and 5 tablets each night this week. Week 11: 5 tablets each morning and 6 tablets each night this week. Week 12: 6 tablets each morning and 6 tablets each night this week. 02/16/21  Yes [provider]  lisinopril (PRINIVIL,ZESTRIL) 20 MG tablet Take 20 mg by mouth daily.   Yes [provider]  methotrexate 2.5 MG tablet Take 6 tablets by mouth every 7 (seven) days. Sunday 02/12/21  Yes [provider]  QUEtiapine (SEROQUEL) 50 MG tablet Take by mouth. Take 50 mg by mouth at bedtime for 1 week, then 2  tablets by mouth at bedtime for 1 week, then 3 tablets by mouth at bedtime for 1 week 02/16/21  Yes [provider]  SUBOXONE 4-1 MG FILM Place 1 Film under the tongue in the morning, at noon, and at bedtime. 10/01/20  Yes [provider]    Exam: Current vital signs: BP (!) 138/97 (BP Location: Right Arm)   Pulse (!) 107   Temp 99 F (37.2 C) (Oral)   Resp 20   Ht 6' (1.829 m)   Wt 94.7 kg   SpO2 100%   BMI 28.32 kg/m   Physical Exam  Constitutional: Appears well-developed and well-nourished.  Psych: Affect appropriate to situation Eyes: No scleral injection HENT: No OP obstruction. Head: Normocephalic. Multiple percings on lips and face Cardiovascular: Normal rate and regular rhythm.  Respiratory: Effort normal, symmetric excursions bilaterally, no audible wheezing. GI: Soft.  No distension. There is no tenderness.  Skin: WDI  Neuro: Mental Status: Patient is awake, alert, oriented to person, place and situation. Patient is not able to give a clear and coherent history. Paucity of speech not fluent and difficult to understand, intact comprehension and repetition. He required constant redirecting as he was focused on watching the television. Visual Fields are full. Pupils are equal, round, and reactive to light. EOMI without ptosis or diploplia.  Facial sensation is symmetric to temperature Facial movement is symmetric.  Hearing is intact to voice. Uvula midline and palate elevates symmetrically. Shoulder shrug is symmetric. Tone is normal. Bulk is normal. 5/5 strength was present in all four extremities. Sensation is symmetric to temperature in the arms and legs. Deep Tendon Reflexes: 2+ and symmetric in the biceps and patellae. Toes are downgoing bilaterally. Gait - Deferred  I have reviewed labs in epic and the pertinent results are:   Ref. Range 03/05/2021 09:50  Sodium Latest Ref Range: 135 - 145 mmol/L 139   I have reviewed the images  obtained: NCT head showed No acute abnormality.  EEG 11/28/2020 was read as normal. Extended EEG 12/03/2020: extended in-office continuously monitored video EEG normal.  Assessment: Nicholas Delgado is a 40 y.o. male PMHx as noted above, seizure disorder with breakthrough seizures currently transitioning from LEV to LMT.   He required constant redirecting as he was focused on watching the television. He said that we could call his wife however to answer.  Dr. Joylene Igo informed the patient had another seizure in ED and loaded with LEV IV and given LMT 50mg .   Recommend discontinuing LEV as he is transitioning off due to unstable mood and loading with VPA as it also had mood stabilizing properties. His LMT dose is low enough that adjustment for VPA is not necessary. LMT titration to appropriate dose would likely take another 1.5 months and VPA 500mg  two times daily would be most effective until  he can be reevaluated in outpatient neurology clinic.  If the patient continues to have seizures can increase 750mg  bid --->1,000mg  bid. If there is risk of noncompliance after discharge, may consider VPA extended release. Routine EEG - ordered. Please call Neurology if EEG shows status epilepticus or if there are any further questions.   Electronically signed by:  , MD Page: Marisue Humble 03/05/2021, 10:35 PM

## 2021-03-05 NOTE — ED Notes (Signed)
Pt AAOx4 now, answering questions appropriately. Had one episode of vomiting in emesis bag, given zofran per MAR.

## 2021-03-05 NOTE — ED Notes (Signed)
Called pharmacy to ask to verify meds. Pt requesting PRN tylenol.

## 2021-03-05 NOTE — ED Notes (Signed)
Pt now has clear speech and answers orientation questions appropriately. Oriented times 4.

## 2021-03-05 NOTE — ED Notes (Signed)
Assisted pt OOB to sit on toilet for BM. Pt had steady gait. Pt stood up to void, no BM. Explained to pt that next time staff would prefer use of urinal. Informed pt that working to get pain meds and other meds verified.

## 2021-03-05 NOTE — ED Notes (Signed)
Pharmacy contacted to send valproate. Per pharmacy they will send once compounding done.

## 2021-03-05 NOTE — ED Notes (Signed)
Pt placed on 2L oxygen. SPO2 is 90-91% with snoring respirations.

## 2021-03-05 NOTE — ED Provider Notes (Signed)
[facility] Emergency Department Provider Note   ____________________________________________   Event Date/Time   First MD Initiated Contact with Patient 03/05/21 [phone]     (approximate)  I have reviewed the triage vital signs and the nursing notes.   HISTORY  Chief Complaint Seizures    HPI Nicholas Delgado is a 40 y.o. male with past medical history of hypertension, hyperlipidemia, and seizures who presents to the ED for seizure.  History is limited due to patient's current postictal state.  Per EMS, patient's family called out due to a seizure episode at home.  Patient appeared postictal upon EMS arrival, was ambulatory but somewhat confused.  During transport, patient had an additional generalized tonic-clonic seizure lasting about 1 minute.  He was given 5 mg of IM Versed with cessation of seizure activity.  Patient currently lives with his mother, who is bedbound.  She was unsure if he has been compliant with seizure medications.  She also reported he had issues with drug abuse in the past, but is not sure if he has been using recently.        Past Medical History:  Diagnosis Date  . Hypertension   . Seizures [facility])     Patient Active Problem List   Diagnosis Date Noted  . Seizure (HCC) 11/24/2020  . Tobacco use disorder 08/25/2015  . Opioid use with withdrawal (HCC) 08/25/2015  . 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 HTN (hypertension) 08/25/2015  . Dyslipidemia 08/25/2015  . Opioid use disorder, severe, dependence (HCC) 08/24/2015    Past Surgical History:  Procedure Laterality Date  . arm surgery Left     Prior to Admission medications   Medication Sig Start Date End Date Taking? Authorizing Provider  amitriptyline (ELAVIL) 25 MG tablet Take 25 mg by mouth at bedtime. 10/15/20   [provider]  atorvastatin (LIPITOR) 20 MG tablet Take 20 mg by mouth at bedtime.     [provider]  carvedilol (COREG) 6.25 MG tablet Take 6.25 mg by mouth 2 (two) times daily with a meal.    [provider]  escitalopram (LEXAPRO) 20 MG tablet Take 20 mg by mouth daily. 06/03/20   [provider]  gabapentin (NEURONTIN) 600 MG tablet Take 600 mg by mouth 3 (three) times daily. 10/13/20   [provider]  levETIRAcetam (KEPPRA) 500 MG tablet Take 1 tablet (500 mg total) by mouth 2 (two) times daily. 01/03/21 02/02/21  Concha Se, MD  levETIRAcetam (KEPPRA) 500 MG tablet Take 1 tablet (500 mg total) by mouth 2 (two) times daily. 01/25/21   Ward, Layla Maw, DO  lisinopril (PRINIVIL,ZESTRIL) 20 MG tablet Take 20 mg by mouth daily.    [provider]  Oxycodone HCl 10 MG TABS Take 10 mg by mouth every 6 (six) hours. 06/25/20   [provider]  pregabalin (LYRICA) 150 MG capsule Take 1 capsule (150 mg total) by mouth 3 (three) times daily. 01/25/21   Ward, Layla Maw, DO  pregabalin (LYRICA) 50 MG capsule Take 1 capsule (50 mg total) by mouth at bedtime. 01/25/21   Ward, Kristen N, DO  SUBOXONE 4-1 MG FILM Place under the tongue. 10/01/20   [provider]  traZODone (DESYREL) 50 MG tablet Take 50-100 mg by mouth at bedtime. 06/03/20   [provider]    Allergies Sulfa antibiotics  History reviewed. No pertinent family history.  Social History Social History   Tobacco Use  .  Smoking status: Current Every Day Smoker    Packs/day: 2.00  . Smokeless tobacco: Never Used  Substance Use Topics  . Alcohol use: No  . Drug use: Not Currently    Comment: percocet 10mg     Review of Systems Unable to obtain secondary to postictal state  ____________________________________________   PHYSICAL EXAM:  VITAL SIGNS: ED Triage Vitals  Enc Vitals Group     BP      Pulse      Resp      Temp      Temp src      SpO2      Weight      Height      Head Circumference      Peak Flow      Pain Score      Pain Loc      Pain  Edu?      Excl. in GC?     Constitutional: Somnolent and difficult to arouse. Eyes: Conjunctivae are normal.  Pupils equal round and reactive to light bilaterally. Head: Atraumatic. Nose: No congestion/rhinnorhea. Mouth/Throat: Mucous membranes are moist. Neck: Normal ROM Cardiovascular: Normal rate, regular rhythm. Grossly normal heart sounds. Respiratory: Normal respiratory effort.  No retractions. Lungs CTAB. Gastrointestinal: Soft and nontender. No distention. Genitourinary: deferred Musculoskeletal: No lower extremity tenderness nor edema. Neurologic: No gross focal neurologic deficits are appreciated, localizes to pain in all 4 extremities. Skin:  Skin is warm, dry and intact. No rash noted. Psychiatric: Unable to assess.  ____________________________________________   LABS (all labs ordered are listed, but only abnormal results are displayed)  Labs Reviewed  COMPREHENSIVE METABOLIC PANEL - Abnormal; Notable for the following components:      Result Value   CO2 18 (*)    Glucose, Bld 141 (*)    All other components within normal limits  SARS CORONAVIRUS 2 (TAT 6-24 HRS)  CBC WITH DIFFERENTIAL/PLATELET  URINE DRUG SCREEN, QUALITATIVE ([facility] ONLY)  LEVETIRACETAM LEVEL  LAMOTRIGINE LEVEL   ____________________________________________  PROCEDURES  Procedure(s) performed (including Critical Care):  Procedures   ____________________________________________   INITIAL IMPRESSION / ASSESSMENT AND PLAN / ED COURSE       40 year old male with past medical history of hypertension, hyperlipidemia, and seizures who presents to the ED for seizure activity.  Patient appears to have had 2 separate seizures without return to baseline mental status, each typical of his prior tonic-clonic seizures.  It is unclear whether there is any head trauma and we will check CT head.  Also screen EKG and labs.  It appears he is supposed to be taking Keppra for seizures, but has had  questionable compliance in the past.  We will check Keppra level and load with 1 g of Keppra.  Patient would benefit from admission given 2 separate seizure episodes without return to baseline, however he has eloped or signed out AMA multiple times in the past.  CT head reviewed by me and shows no obvious hemorrhage, negative for acute process per radiology.  Labs thus far are unremarkable, patient now gradually returning to baseline mental status.  He can answer questions, but is unsure whether he has been taking his seizure medicine recently, does state he was switched off of Keppra a couple of weeks ago but is unsure what he is taking now.  Reviewing his chart, he has been switched to Lamictal and we will give dose of this here in the ED.  Given multiple seizure episodes today, case discussed with hospitalist for admission.  ____________________________________________   FINAL CLINICAL IMPRESSION(S) / ED DIAGNOSES  Final diagnoses:  Seizure [facility])     ED Discharge Orders    None       Note:  This document was prepared using Dragon voice recognition software and may include unintentional dictation errors.   Chesley Noon, MD 03/05/21 1136

## 2021-03-06 ENCOUNTER — Other Ambulatory Visit: Payer: Self-pay

## 2021-03-06 DIAGNOSIS — Z20822 Contact with and (suspected) exposure to covid-19: Secondary | ICD-10-CM | POA: Diagnosis present

## 2021-03-06 DIAGNOSIS — F17210 Nicotine dependence, cigarettes, uncomplicated: Secondary | ICD-10-CM | POA: Diagnosis present

## 2021-03-06 DIAGNOSIS — E785 Hyperlipidemia, unspecified: Secondary | ICD-10-CM | POA: Diagnosis present

## 2021-03-06 DIAGNOSIS — R569 Unspecified convulsions: Secondary | ICD-10-CM | POA: Diagnosis present

## 2021-03-06 DIAGNOSIS — I10 Essential (primary) hypertension: Secondary | ICD-10-CM | POA: Diagnosis present

## 2021-03-06 DIAGNOSIS — Z79899 Other long term (current) drug therapy: Secondary | ICD-10-CM | POA: Diagnosis not present

## 2021-03-06 DIAGNOSIS — R410 Disorientation, unspecified: Secondary | ICD-10-CM | POA: Diagnosis present

## 2021-03-06 DIAGNOSIS — Z882 Allergy status to sulfonamides status: Secondary | ICD-10-CM | POA: Diagnosis not present

## 2021-03-06 DIAGNOSIS — G40909 Epilepsy, unspecified, not intractable, without status epilepticus: Secondary | ICD-10-CM | POA: Diagnosis present

## 2021-03-06 DIAGNOSIS — F32A Depression, unspecified: Secondary | ICD-10-CM | POA: Diagnosis present

## 2021-03-06 LAB — LAMOTRIGINE LEVEL: Lamotrigine Lvl: <1 ug/mL — ABNORMAL LOW (ref 2.0–20.0)

## 2021-03-06 MED ORDER — VALPROIC ACID 250 MG PO CAPS: 500.0000 mg | ORAL_CAPSULE | Freq: Two times a day (BID) | ORAL | Status: DC

## 2021-03-06 MED ORDER — LAMOTRIGINE 25 MG PO TABS
25.0000 mg | ORAL_TABLET | Freq: Two times a day (BID) | ORAL | Status: DC
  Administered 2021-03-06 – 2021-03-07 (×2): 25 mg via ORAL
  Filled 2021-03-06 (×2): qty 1

## 2021-03-06 NOTE — Plan of Care (Signed)
Alert and oriented x 4 with occasional blank stares without answering. Denies pain or discomfort. IV fluids infusing and tolerated well. Pt pulled one IV out, reminded not to do that again. Bed padded for seizure precautions. Room near nurses' station. Oriented to room and call bell system. Had an emesis episode at 0415 but say no more nausea at this time. Problem: Education: Goal: Knowledge of General Education information will improve Description: Including pain rating scale, medication(s)/side effects and non-pharmacologic comfort measures Outcome: Progressing   Problem: Health Behavior/Discharge Planning: Goal: Ability to manage health-related needs will improve Outcome: Progressing   Problem: Clinical Measurements: Goal: Ability to maintain clinical measurements within normal limits will improve Outcome: Progressing Goal: Will remain free from infection Outcome: Progressing Goal: Diagnostic test results will improve Outcome: Progressing Goal: Respiratory complications will improve Outcome: Progressing Goal: Cardiovascular complication will be avoided Outcome: Progressing   Problem: Activity: Goal: Risk for activity intolerance will decrease Outcome: Progressing   Problem: Nutrition: Goal: Adequate nutrition will be maintained Outcome: Progressing   Problem: Coping: Goal: Level of anxiety will decrease Outcome: Progressing   Problem: Elimination: Goal: Will not experience complications related to bowel motility Outcome: Progressing Goal: Will not experience complications related to urinary retention Outcome: Progressing   Problem: Pain Managment: Goal: General experience of comfort will improve Outcome: Progressing   Problem: Safety: Goal: Ability to remain free from injury will improve Outcome: Progressing   Problem: Skin Integrity: Goal: Risk for impaired skin integrity will decrease Outcome: Progressing

## 2021-03-06 NOTE — Plan of Care (Signed)
Continuing with plan of care. 

## 2021-03-06 NOTE — Procedures (Signed)
Patient Name: [name]  MRN: [id number]  Epilepsy Attending: Charlsie Quest  Referring Physician/Provider: Dr Marisue Humble Date: 03/06/2021 Duration: 25.20 mins  Patient history: Nicholas Delgado is a 40 y.o. male PMHx as noted above, seizure disorder with breakthrough seizures currently transitioning from LEV to LMT. EEG to evaluate for seizure  Level of alertness: Awake  AEDs during EEG study: PGB, LTG, GBP, VPA   Technical aspects: This EEG study was done with scalp electrodes positioned according to the 10-20 International system of electrode placement. Electrical activity was acquired at a sampling rate of 500Hz  and reviewed with a high frequency filter of 70Hz  and a low frequency filter of 1Hz . EEG data were recorded continuously and digitally stored.   Description: The posterior dominant rhythm consists of 8 Hz activity of moderate voltage (25-35 uV) seen predominantly in posterior head regions, symmetric and reactive to eye opening and eye closing. EEG showed intermittent generalized polymorphic sharply contoured 5 to 6 Hz theta slowing. Physiologic photic driving was not seen during photic stimulation.  Hyperventilation was not performed.     ABNORMALITY - Intermittent slow, generalized  IMPRESSION: This study is suggestive of mild diffuse encephalopathy, nonspecific etiology. No seizures or epileptiform discharges were seen throughout the recording.  Sadeen Wiegel 

## 2021-03-06 NOTE — Progress Notes (Signed)
[facility] Progress Note  Patient: Nicholas Delgado    XBD:[id number]  DOA: 03/05/2021     Date of Service: the patient was seen and examined on 03/06/2021  Chief Complaint  Patient presents with  . Seizures   Brief hospital course: [name] is a 40 y.o. male with medical history significant for seizure disorder and nicotine dependence who presented to the ER via EMS for evaluation after he had a witnessed seizure episode.  His mother witnessed the seizure and called EMS and when they arrived patient had another seizure admitted to the hospital.  Per EMS it was a tonic-clonic seizure and lasted between 1 to 2 minutes. Patient received 5 mg of midazolam IM.  In the ED He was loaded with 1 g of Keppra in the emergency room and also received a dose of Lamictal 50 mg x 1 dose.  Neurology was consulted and patient was admitted for further management as below    Assessment and Plan: Principal Problem:   Seizures (HCC) Active Problems:   Tobacco use disorder   HTN (hypertension)    Seizure Patient has a history of epilepsy and his Keppra was recently switched to Lamictal by his neurologist Continue Lamictal 25 mg p.o. twice daily PTA dose Started Depakote DR 500 twice daily as per neurology Continued gabapentin 600 3 times daily Continue pregabalin PTA Dose Neurology following, EEG negative Continue seizure precaution Patient can be discharged home tomorrow if no seizure activity during hospital stay and back to his baseline Use IV Ativan as needed seizures   Hypertension Continue carvedilol and lisinopril   Depression Continue Lexapro and amitriptyline   Nicotine dependence Smoking cessation was discussed with patient in detail Place patient on nicotine transdermal patch 21 mg daily   Body mass index is 28.32 kg/m.        Diet: Heart healthy DVT Prophylaxis: Subcutaneous Lovenox   Advance goals of care discussion: Full code  Family  Communication: family was NOT present at bedside, at the time of interview.  The pt provided permission to discuss medical plan with the family. Opportunity was given to ask question and all questions were answered satisfactorily.  D/w patient's wife over the phone  Disposition:  Pt is from home, admitted with seizure and postictal confusion, still has postictal confusion, which precludes a safe discharge. Discharge to home, when if no seizure activity overnight and back to his baseline, patient can be discharged home tomorrow..  Subjective: No overnight issues, patient was admitted yesterday due to seizures, patient is still having postictal confusion, still he is not back to his baseline as per discussion with his wife over the phone. Patient was complaining of left shoulder pain possible secondary to seizures, denied any other active issues.  No headache or dizziness, no chest pain or shortness of breath, no palpitations.    Physical Exam: General:  alert oriented to time, place, and person.  Appear in mild distress, affect depressed Eyes: PERRLA ENT: Oral Mucosa Clear, moist  Neck: no JVD,  Cardiovascular: S1 and S2 Present, no Murmur,  Respiratory: good respiratory effort, Bilateral Air entry equal and Decreased, no Crackles, no wheezes Abdomen: Bowel Sound present, Soft and no tenderness,  Skin: no Extremities: no Pedal edema, no calf tenderness Neurologic: without any new focal findings Gait not checked due to patient safety concerns  Vitals:   03/05/21 2014 03/06/21 0439 03/06/21 1130 03/06/21 1517  BP: (!) 138/97 122/80 124/72 129/84  Pulse: (!) 107 95 79 73  Resp: 20 18 18 16   Temp: 99 F (37.2 C) 98.8 F (37.1 C) 98.2 F (36.8 C) 98 F (36.7 C)  TempSrc: Oral Oral Oral Oral  SpO2: 100% 98% 98% 97%  Weight: 94.7 kg     Height: 6' (1.829 m)       Intake/Output Summary (Last 24 hours) at March 07, 2021 1628 Last data filed at Mar 07, 2021 1020 Gross per 24 hour  Intake  1120.44 ml  Output 1050 ml  Net 70.44 ml   Filed Weights   03/05/21 0941 03/05/21 2014  Weight: 101.4 kg 94.7 kg    Data Reviewed: I have personally reviewed and interpreted daily labs, tele strips, imagings as discussed above. I reviewed all nursing notes, pharmacy notes, vitals, pertinent old records I have discussed plan of care as described above with RN and patient/family.  CBC: Recent Labs  Lab 03/05/21 0950  WBC 9.3  NEUTROABS 6.7  HGB 13.6  HCT 40.9  MCV 89.9  PLT 175   Basic Metabolic Panel: Recent Labs  Lab 03/05/21 0950  NA 139  K 3.6  CL 106  CO2 18*  GLUCOSE 141*  BUN 11  CREATININE 0.86  CALCIUM 9.1    Studies: EEG adult  Result Date: 2021/03/07 03/08/2021, MD     03-07-21  3:46 PM Patient Name: Nicholas Delgado MRN: Antonieta Pert Epilepsy Attending: [id number] Referring Physician/Provider: Dr Charlsie Quest Date: 03-07-2021 Duration: 25.20 mins Patient history: STEPHANE Delgado is a 40 y.o. male PMHx as noted above, seizure disorder with breakthrough seizures currently transitioning from LEV to LMT. EEG to evaluate for seizure Level of alertness: Awake AEDs during EEG study: PGB, LTG, GBP, VPA Technical aspects: This EEG study was done with scalp electrodes positioned according to the 10-20 International system of electrode placement. Electrical activity was acquired at a sampling rate of 500Hz  and reviewed with a high frequency filter of 70Hz  and a low frequency filter of 1Hz . EEG data were recorded continuously and digitally stored. Description: The posterior dominant rhythm consists of 8 Hz activity of moderate voltage (25-35 uV) seen predominantly in posterior head regions, symmetric and reactive to eye opening and eye closing. EEG showed intermittent generalized polymorphic sharply contoured 5 to 6 Hz theta slowing. Physiologic photic driving was not seen during photic stimulation.  Hyperventilation was not performed.   ABNORMALITY - Intermittent  slow, generalized IMPRESSION: This study is suggestive of mild diffuse encephalopathy, nonspecific etiology. No seizures or epileptiform discharges were seen throughout the recording. [name] 44    Scheduled Meds: . amitriptyline  25 mg Oral QHS  . atorvastatin  20 mg Oral QHS  . carvedilol  6.25 mg Oral BID WC  . divalproex  500 mg Oral Q12H  . enoxaparin (LOVENOX) injection  40 mg Subcutaneous Q24H  . escitalopram  20 mg Oral Daily  . gabapentin  600 mg Oral TID  . influenza vac split quadrivalent PF  0.5 mL Intramuscular Tomorrow-1000  . lamoTRIgine  25 mg Oral BID  . lisinopril  20 mg Oral Daily  . nicotine  21 mg Transdermal Daily  . pregabalin  150 mg Oral TID  . pregabalin  50 mg Oral QHS  . traZODone  50-100 mg Oral QHS   Continuous Infusions: . sodium chloride 75 mL/hr (03/07/21 0038)   PRN Meds: acetaminophen **OR** acetaminophen, LORazepam, ondansetron **OR** ondansetron (ZOFRAN) IV  Time spent: 35 minutes  Author: . MD [facility] Mar 07, 2021 4:28 PM  To reach On-call, see  care teams to locate the attending and reach out to them via [email or web address]. If 7PM-7AM, please contact night-coverage If you still have difficulty reaching the attending provider, please page the [facility] (Director on Call) for [facility] on amion for assistance.

## 2021-03-06 NOTE — Progress Notes (Signed)
Neurology Progress Note  Patient ID: Nicholas Delgado is a 40 y.o. with PMHx of  has a past medical history of Hypertension and Seizures (HCC). as well as concern for rheumatoid or psoriatic arthritis   Initially consulted for: seizures in the setting of medication changes   Notable prior workup:  12/03/2020 EXTENDED MONITORING EEG IMPRESSION: This extended in-office continuously monitored video EEG in the awake and asleep states is within normal limits.  11/28/2020 ROUTINE EEG IMPRESSION: This routine EEG in the awake and asleep states is within normal limits.  01/26/2020 MRI BRAIN WO IMPRESSION:  1. No acute intracranial abnormality identified.  2. Moderately advanced for age but nonspecific signal changes in the  bilateral cerebral white matter and pons.  3. Motion degradedthin slice temporal lobe imaging with no  convincing mesial temporal sclerosis.  4. Chronic right middle ear and mastoid opacification.   Prior medications: - Keppra, discontinued due to c/f mood issues and poor adherence   Major interval events:  Additional history obtained from the patient's wife: - Sunday to Sunday wife fills medication box  - Patient stays at [facility] house for supervision as wife works long hours, though wife does accompany him to all doctors appointments and fills his pillboxes - Per wife, current lamotrigine dose is 2 pills a day -- one in the morning and one at night  Subjective: -Reports he still feels somewhat confused (and thinks it is early March but otherwise oriented to month and year) -Reports he is not sure he is taking his lamotrigine at all but that I would need to check with his wife to confirm -Some soreness of the left shoulder  Exam: Vitals:   03/05/21 2014 03/06/21 0439  BP: (!) 138/97 122/80  Pulse: (!) 107 95  Resp: 20 18  Temp: 99 F (37.2 C) 98.8 F (37.1 C)  SpO2: 100% 98%   Gen: In bed, comfortable  Resp: non-labored breathing, no grossly audible  wheezing Cardiac: Perfusing extremities well  Abd: soft, nt  Neuro: MS: Awake, alert, cooperative, naming and repetition intact.  Oriented to hospital, the fact he is here for seizures, CN: Pupils equal round and reactive to light, external ocular movements intact, face symmetric, tongue midline Motor/gait: Negative orbit sign.  Able to stand and rise on his heels/toes.  Able to tandem gait Sensory: No complaints of any numbness  Pertinent Labs: Lamotrigine level and levetiracetam levels still pending Utox positive for benzos, TCA, and opiates  Impression: This is a 40 year old man with past medical history significant for hypertension, mood disorder, and seizures, presenting with seizures in the setting of medication changes.  Now appears well-controlled on Depakote.  Discussed with his wife that Depakote may have great benefit for his mood as well as his seizures but does have some more serious long-term side effects of then lamotrigine (such as risk for pancreatitis, osteoporosis, hair loss).  However given that it will take some time for lamotrigine to be at a therapeutic level and he has had several seizures, would continue on Depakote until lamotrigine is at a therapeutic dose.  Alternatively if he does very well on Depakote, defer to Dr. Malvin Johns whether lamotrigine should be discontinued and he should be on Depakote monotherapy in the future.  Recommendations: - Continue depakote 500 mg BID  - Currently on lamotrigine 25 mg BID per wife - Lamotrigine updated titration plan: Hold at current dose until outpatient neurology followup, with further titration per Dr. Malvin Johns - Patient should be seen by Dr.  Potter within 2 to 4 weeks for follow-up - Discharge instructions should include instructions to return promptly for evaluation of any rash that develops as well as standard seizure precautions: Per [facility] statutes, patients with seizures are not allowed to drive until  they have  been seizure-free for six months. Use caution when using heavy equipment or power tools. Avoid working on ladders or at heights. Take showers instead of baths. Ensure the water temperature is not too high on the home water heater. Do not go swimming alone. When caring for infants or small children, sit down when holding, feeding, or changing them to minimize risk of injury to the child in the event you have a seizure.  To reduce risk of seizures, maintain good sleep hygiene avoid alcohol and illicit drug use, take all anti-seizure medications as prescribed.  - Neurology will be available on an as-needed basis going forward, please repage if new questions arise  Brooke Dare MD-PhD [facility] [phone]  [facility] coverage for [facility] is from 8 AM to 4 AM in-house and 4 PM to 8 PM by telephone/video. 8 PM to 8 AM emergent questions or overnight urgent questions should be addressed to Teleneurology On-call or [facility] neurohospitalist; contact information can be found on AMION  A total of 35 minutes were spent in care of this patient today, over 50% of which was in direct patient/family contact.

## 2021-03-06 NOTE — Progress Notes (Signed)
eeg done °

## 2021-03-06 NOTE — Care Management Obs Status (Addendum)
MEDICARE OBSERVATION STATUS NOTIFICATION   Patient Details  Name: Nicholas Delgado MRN: [id number] Date of Birth: 1981-04-19   Medicare Observation Status Notification Given:   yes 06 Mar 2021    Caryn Section, RN 03/06/2021, 3:16 PM

## 2021-03-07 MED ORDER — PREGABALIN 50 MG PO CAPS: 50.0000 mg | ORAL_CAPSULE | Freq: Every day | ORAL | 0 refills | Status: DC

## 2021-03-07 MED ORDER — TRAZODONE HCL 50 MG PO TABS
50.0000 mg | ORAL_TABLET | Freq: Every day | ORAL | 0 refills | Status: AC
Start: 2021-03-07 — End: ?

## 2021-03-07 MED ORDER — GABAPENTIN 600 MG PO TABS: 600.0000 mg | ORAL_TABLET | Freq: Three times a day (TID) | ORAL | 0 refills | Status: DC

## 2021-03-07 MED ORDER — DIVALPROEX SODIUM 500 MG PO DR TAB: 500.0000 mg | DELAYED_RELEASE_TABLET | Freq: Two times a day (BID) | ORAL | 2 refills | Status: DC

## 2021-03-07 MED ORDER — PREGABALIN 150 MG PO CAPS: 150.0000 mg | ORAL_CAPSULE | Freq: Three times a day (TID) | ORAL | 0 refills | Status: DC

## 2021-03-07 MED ORDER — ESCITALOPRAM OXALATE 20 MG PO TABS: 20.0000 mg | ORAL_TABLET | Freq: Every day | ORAL | 2 refills | Status: DC

## 2021-03-07 MED ORDER — AMITRIPTYLINE HCL 25 MG PO TABS: 25.0000 mg | ORAL_TABLET | Freq: Every day | ORAL | 2 refills | Status: DC

## 2021-03-07 MED ORDER — LAMOTRIGINE 25 MG PO TABS: 25.0000 mg | ORAL_TABLET | Freq: Two times a day (BID) | ORAL | 2 refills | Status: DC

## 2021-03-07 NOTE — Progress Notes (Addendum)
Patient's states that wife is downstairs ready to pick him up, offered patient morning medications, patient states that he will take his morning medications at home as his wife needs to get back to work.  Patient's wife at bedside and encouraged husband to take morning medications.  Morning medications administered to patient.

## 2021-03-07 NOTE — Plan of Care (Signed)
Discharge teaching completed with patient and spouse; patient is in stable condition.

## 2021-03-07 NOTE — Plan of Care (Signed)
Continuing with plan of care. 

## 2021-03-07 NOTE — Discharge Summary (Signed)
Physician Discharge Summary  Nicholas Delgado YQM:[id number] DOB: [birth year] DOA: 03/05/2021  PCP: Forest Gleason, MD  Admit date: 03/05/2021 Discharge date: 03/07/2021  Admitted From: home Disposition:  home  Recommendations for Outpatient Follow-up:  1. Follow up with PCP in 1-2 weeks 2. Please obtain BMP/CBC in one week 3. Please follow up with Dr. Malvin Johns, neurology, in 2-4 weeks  Home Health: No  Equipment/Devices: None   Discharge Condition: Stable  CODE STATUS: Full  Diet recommendation: Heart Healthy  Discharge Diagnoses: Principal Problem:   Seizures (HCC) Active Problems:   Tobacco use disorder   HTN (hypertension)    Summary of HPI and Hospital Course:   Nicholas Delgado a 39 y.o.malewith medical history significant forseizure disorder and nicotine dependence who presented to the ER via EMS for evaluation after he had a witnessed seizure episode. His mother witnessed the seizure and called EMS and when they arrived patient had another seizure admitted to the hospital. Per EMS it was a tonic-clonic seizure and lasted between 1 to 2 minutes. Patient received 5 mg of midazolam IM.  In the ED He was loaded with 1 g of Keppra in the emergency room and also received a dose of Lamictal 50 mg x 1 dose.  Neurology was consulted and patient was admitted for further management as below.  Pt reported epilepsy dx is recent, within past 6 months or so.   Seizure Patient has a history of epilepsy and his Keppra was recently switched to Lamictal by his neurologist. Resumed on Lamictal 25 mg p.o. twice daily (PTA dose) Started Depakote DR 500 mg BID as per neurology Continued gabapentin 600 mg TID Continue pregabalin PTA Dose EEG negative No seizure activity occurred during admission   Patient stabilized, clinically improved and at his baseline and cleared by neurology for discharge home.    Discharge Instructions   Discharge Instructions    Call MD for:   Complete  by: As directed    Seizure activity, new skin rash   Call MD for:  extreme fatigue   Complete by: As directed    Call MD for:  persistant dizziness or light-headedness   Complete by: As directed    Call MD for:  temperature >100.4   Complete by: As directed    Diet - low sodium heart healthy   Complete by: As directed    Discharge instructions   Complete by: As directed    Please take all medications exactly as prescribed.  The neurologist here in the hospital continued you on Lamictal (at 25 mg twice/day) and added a new medication called Depakote (aka divalproex).    Please call Dr. Daisy Blossom office on Monday morning to get a follow up appointment in 2-4 weeks.   Increase activity slowly   Complete by: As directed      Allergies as of 03/07/2021      Reactions   Sulfa Antibiotics Shortness Of Breath, Anaphylaxis   OTHER REACTION      Medication List    TAKE these medications   amitriptyline 25 MG tablet Commonly known as: ELAVIL Take 1 tablet (25 mg total) by mouth at bedtime.   atorvastatin 20 MG tablet Commonly known as: LIPITOR Take 20 mg by mouth at bedtime.   carvedilol 6.25 MG tablet Commonly known as: COREG Take 6.25 mg by mouth 2 (two) times daily with a meal.   divalproex 500 MG DR tablet Commonly known as: DEPAKOTE Take 1 tablet (500 mg total) by mouth every 12 (  twelve) hours.   escitalopram 20 MG tablet Commonly known as: LEXAPRO Take 1 tablet (20 mg total) by mouth daily.   folic acid 1 MG tablet Commonly known as: FOLVITE Take 1 mg by mouth daily.   gabapentin 600 MG tablet Commonly known as: NEURONTIN Take 1 tablet (600 mg total) by mouth 3 (three) times daily.   lamoTRIgine 25 MG tablet Commonly known as: LAMICTAL Take 1 tablet (25 mg total) by mouth 2 (two) times daily. What changed: See the new instructions.   lisinopril 20 MG tablet Commonly known as: ZESTRIL Take 20 mg by mouth daily.   methotrexate 2.5 MG tablet Take 6 tablets  by mouth every 7 (seven) days. Sunday   pregabalin 150 MG capsule Commonly known as: LYRICA Take 1 capsule (150 mg total) by mouth 3 (three) times daily.   pregabalin 50 MG capsule Commonly known as: LYRICA Take 1 capsule (50 mg total) by mouth at bedtime.   QUEtiapine 50 MG tablet Commonly known as: SEROQUEL Take by mouth. Take 50 mg by mouth at bedtime for 1 week, then 2 tablets by mouth at bedtime for 1 week, then 3 tablets by mouth at bedtime for 1 week   Suboxone 4-1 MG Film Generic drug: Buprenorphine HCl-Naloxone HCl Place 1 Film under the tongue in the morning, at noon, and at bedtime.   traZODone 50 MG tablet Commonly known as: DESYREL Take 1-2 tablets (50-100 mg total) by mouth at bedtime.       Allergies  Allergen Reactions  . Sulfa Antibiotics Shortness Of Breath and Anaphylaxis    OTHER REACTION     If you experience worsening of your admission symptoms, develop shortness of breath, life threatening emergency, suicidal or homicidal thoughts you must seek medical attention immediately by calling 911 or calling your MD immediately  if symptoms less severe.    Please note   You were cared for by a hospitalist during your hospital stay. If you have any questions about your discharge medications or the care you received while you were in the hospital after you are discharged, you can call the unit and asked to speak with the hospitalist on call if the hospitalist that took care of you is not available. Once you are discharged, your primary care physician will handle any further medical issues. Please note that NO REFILLS for any discharge medications will be authorized once you are discharged, as it is imperative that you return to your primary care physician (or establish a relationship with a primary care physician if you do not have one) for your aftercare needs so that they can reassess your need for medications and monitor your lab  values.   Consultations:  Neurology    Procedures/Studies: DG Ankle Complete Left  Result Date: 02/12/2021 CLINICAL DATA:  40 year old male with pain for 1 week. History of seizures, unknown injury. History of psoriatic arthritis. EXAM: LEFT ANKLE COMPLETE - 3+ VIEW COMPARISON:  Left ankle series 12/04/2019.  Left foot series today. FINDINGS: Bone mineralization is within normal limits. Chronic ossific fragments about the medial malleolus (stable) and lateral malleolus (new since 2020). Stable mortise joint alignment. Talar dome remains intact. There is chronic spurring at the anterior talus which has mildly progressed. Overlying anterior ankle soft tissue swelling and evidence of joint effusion which is similar to the prior study. No acute osseous abnormality identified. IMPRESSION: 1. Chronic or recurrent left ankle joint effusion, soft tissue swelling. 2. Mild progression since 2020 of chronic posttraumatic and degenerative  osseous changes at the ankle. No acute osseous abnormality identified. Electronically Signed   By: Odessa Fleming M.D.   On: 02/12/2021 05:09   CT Head Wo Contrast  Result Date: 03/05/2021 CLINICAL DATA:  Status post seizure today. EXAM: CT HEAD WITHOUT CONTRAST TECHNIQUE: Contiguous axial images were obtained from the base of the skull through the vertex without intravenous contrast. COMPARISON:  Brain MRI 11/24/2020.  Head CT 01/03/2021. FINDINGS: Brain: No evidence of acute infarction, hemorrhage, hydrocephalus, extra-axial collection or mass lesion/mass effect. Vascular: No hyperdense vessel or unexpected calcification. Skull: Intact.  No focal lesion. 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 Other: None. IMPRESSION: No acute abnormality. 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 Electronically Signed   By: Drusilla Kanner M.D.   On: 03/05/2021 10:55   DG Foot Complete Left  Result Date: 02/12/2021 CLINICAL DATA:  40 year old male  with pain for 1 week. History of seizures, unknown injury. History of psoriatic arthritis. EXAM: LEFT FOOT - COMPLETE 3+ VIEW COMPARISON:  Ankle series 12/04/2019. FINDINGS: Bone mineralization is within normal limits. There is no evidence of fracture or dislocation. Mild 1st MTP joint space loss and subchondral sclerosis. Elsewhere joint spaces and alignment appear preserved. There is no other arthropathy or focal bone abnormality. Ankle joint effusion and/or soft tissue swelling is noted, similar to the 2020 ankle series. No other soft tissue abnormality. IMPRESSION: 1. No acute osseous abnormality identified in the left foot. 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 2. Chronic or recurrent left ankle joint effusion and/or soft tissue swelling. Electronically Signed   By: Odessa Fleming M.D.   On: 02/12/2021 05:07   EEG adult  Result Date: 03/06/2021 Charlsie Quest, MD     03/06/2021  3:46 PM Patient Name: Nicholas Delgado MRN: [id number] Epilepsy Attending: Charlsie Quest Referring Physician/Provider: Dr Marisue Humble Date: 03/06/2021 Duration: 25.20 mins Patient history: ALAA EYERMAN is a 40 y.o. male PMHx as noted above, seizure disorder with breakthrough seizures currently transitioning from LEV to LMT. EEG to evaluate for seizure Level of alertness: Awake AEDs during EEG study: PGB, LTG, GBP, VPA Technical aspects: This EEG study was done with scalp electrodes positioned according to the 10-20 International system of electrode placement. Electrical activity was acquired at a sampling rate of 500Hz  and reviewed with a high frequency filter of 70Hz  and a low frequency filter of 1Hz . EEG data were recorded continuously and digitally stored. Description: The posterior dominant rhythm consists of 8 Hz activity of moderate voltage (25-35 uV) seen predominantly in posterior head regions, symmetric and reactive to eye opening and eye closing. EEG showed intermittent generalized polymorphic sharply contoured 5 to 6 Hz theta  slowing. Physiologic photic driving was not seen during photic stimulation.  Hyperventilation was not performed.   ABNORMALITY - Intermittent slow, generalized IMPRESSION: This study is suggestive of mild diffuse encephalopathy, nonspecific etiology. No seizures or epileptiform discharges were seen throughout the recording. [name] O Yadav      EEG   Subjective: Pt seen at bedside.  Feels well.  No seizure activity.  No reports of bothersome side effects of medications.  No acute complaints.   Discharge Exam: Vitals:   03/06/21 1952 03/07/21 0447  BP: 118/62 124/81  Pulse: 82 70  Resp: 20 16  Temp: 97.7 F (36.5 C) (!) 97.5 F (36.4 C)  SpO2: 97% 97%   Vitals:   03/06/21 1130 03/06/21 1517 03/06/21 1952 03/07/21 0447  BP: 124/72 129/84 118/62 124/81  Pulse:  79 73 82 70  Resp: 18 16 20 16   Temp: 98.2 F (36.8 C) 98 F (36.7 C) 97.7 F (36.5 C) (!) 97.5 F (36.4 C)  TempSrc: Oral Oral Oral Oral  SpO2: 98% 97% 97% 97%  Weight:      Height:        General: Pt is alert, awake, not in acute distress Cardiovascular: RRR, S1/S2 +, no rubs, no gallops Respiratory: CTA bilaterally, no wheezing, no rhonchi Abdominal: Soft, NT, ND, bowel sounds + Extremities: no edema, no cyanosis    The results of significant diagnostics from this hospitalization (including imaging, microbiology, ancillary and laboratory) are listed below for reference.     Microbiology: Recent Results (from the past 240 hour(s))  SARS CORONAVIRUS 2 (TAT 6-24 HRS) Nasopharyngeal Nasopharyngeal Swab     Status: None   Collection Time: 03/05/21 11:30 AM   Specimen: Nasopharyngeal Swab  Result Value Ref Range Status   SARS Coronavirus 2 NEGATIVE NEGATIVE Final    Comment: (NOTE) SARS-CoV-2 target nucleic acids are NOT DETECTED.  The SARS-CoV-2 RNA is generally detectable in upper and lower respiratory specimens during the acute phase of infection. Negative results do not preclude SARS-CoV-2 infection,  do not rule out co-infections with other pathogens, and should not be used as the sole basis for treatment or other patient management decisions. Negative results must be combined with clinical observations, patient history, and epidemiological information. The expected result is Negative.  Fact Sheet for Patients: 03/07/21  Fact Sheet for Healthcare Providers: HairSlick.no  This test is not yet approved or cleared by the [place] FDA and  has been authorized for detection and/or diagnosis of SARS-CoV-2 by FDA under an Emergency Use Authorization (EUA). This EUA will remain  in effect (meaning this test can be used) for the duration of the COVID-19 declaration under Se ction 564(b)(1) of the Act, 21 U.S.C. section 360bbb-3(b)(1), unless the authorization is terminated or revoked sooner.  Performed at [facility], [address], [place], [place]      Labs: BNP (last 3 results) No results for input(s): BNP in the last 8760 hours. Basic Metabolic Panel: Recent Labs  Lab 03/05/21 0950  NA 139  K 3.6  CL 106  CO2 18*  GLUCOSE 141*  BUN 11  CREATININE 0.86  CALCIUM 9.1   Liver Function Tests: Recent Labs  Lab 03/05/21 0950  AST 31  ALT 23  ALKPHOS 107  BILITOT 0.6  PROT 7.6  ALBUMIN 4.2   No results for input(s): LIPASE, AMYLASE in the last 168 hours. No results for input(s): AMMONIA in the last 168 hours. CBC: Recent Labs  Lab 03/05/21 0950  WBC 9.3  NEUTROABS 6.7  HGB 13.6  HCT 40.9  MCV 89.9  PLT 175   Cardiac Enzymes: No results for input(s): CKTOTAL, CKMB, CKMBINDEX, TROPONINI in the last 168 hours. BNP: Invalid input(s): POCBNP CBG: No results for input(s): GLUCAP in the last 168 hours. D-Dimer No results for input(s): DDIMER in the last 72 hours. Hgb A1c No results for input(s): HGBA1C in the last 72 hours. Lipid Profile No results for input(s): CHOL, HDL,  LDLCALC, TRIG, CHOLHDL, LDLDIRECT in the last 72 hours. Thyroid function studies No results for input(s): TSH, T4TOTAL, T3FREE, THYROIDAB in the last 72 hours.  Invalid input(s): FREET3 Anemia work up No results for input(s): VITAMINB12, FOLATE, FERRITIN, TIBC, IRON, RETICCTPCT in the last 72 hours. Urinalysis    Component Value Date/Time   COLORURINE YELLOW  01/25/2021 0205   APPEARANCEUR CLEAR 01/25/2021 0205   APPEARANCEUR Clear 07/28/2014 1806   LABSPEC 1.025 01/25/2021 0205   LABSPEC 1.035 07/28/2014 1806   PHURINE 5.5 01/25/2021 0205   GLUCOSEU NEGATIVE 01/25/2021 0205   GLUCOSEU Negative 07/28/2014 1806   HGBUR NEGATIVE 01/25/2021 0205   BILIRUBINUR NEGATIVE 01/25/2021 0205   BILIRUBINUR Negative 07/28/2014 1806   KETONESUR TRACE (A) 01/25/2021 0205   PROTEINUR 30 (A) 01/25/2021 0205   NITRITE NEGATIVE 01/25/2021 0205   LEUKOCYTESUR NEGATIVE 01/25/2021 0205   LEUKOCYTESUR Negative 07/28/2014 1806   Sepsis Labs Invalid input(s): PROCALCITONIN,  WBC,  LACTICIDVEN Microbiology Recent Results (from the past 240 hour(s))  SARS CORONAVIRUS 2 (TAT 6-24 HRS) Nasopharyngeal Nasopharyngeal Swab     Status: None   Collection Time: 03/05/21 11:30 AM   Specimen: Nasopharyngeal Swab  Result Value Ref Range Status   SARS Coronavirus 2 NEGATIVE NEGATIVE Final    Comment: (NOTE) SARS-CoV-2 target nucleic acids are NOT DETECTED.  The SARS-CoV-2 RNA is generally detectable in upper and lower respiratory specimens during the acute phase of infection. Negative results do not preclude SARS-CoV-2 infection, do not rule out co-infections with other pathogens, and should not be used as the sole basis for treatment or other patient management decisions. Negative results must be combined with clinical observations, patient history, and epidemiological information. The expected result is Negative.  Fact Sheet for Patients: HairSlick.nohttps://www.fda.gov/media/138098/download  Fact Sheet for  Healthcare Providers: quierodirigir.comhttps://www.fda.gov/media/138095/download  This test is not yet approved or cleared by the [place] FDA and  has been authorized for detection and/or diagnosis of SARS-CoV-2 by FDA under an Emergency Use Authorization (EUA). This EUA will remain  in effect (meaning this test can be used) for the duration of the COVID-19 declaration under Se ction 564(b)(1) of the Act, 21 U.S.C. section 360bbb-3(b)(1), unless the authorization is terminated or revoked sooner.  Performed at [facility], [address] [place], [place] [address]      Time coordinating discharge: Over 30 minutes  SIGNED:   Pennie BanterKelly A Velton Roselle, DO [facility] 03/07/2021, 9:48 AM   If 7PM-7AM, please contact night-coverage [email or web address]

## 2021-03-10 LAB — LEVETIRACETAM LEVEL: Levetiracetam Lvl: <1 ug/mL — ABNORMAL LOW (ref 10.0–40.0)

## 2021-04-11 ENCOUNTER — Encounter: Payer: Self-pay | Admitting: Emergency Medicine

## 2021-04-11 ENCOUNTER — Emergency Department: Payer: Medicare Other

## 2021-04-11 ENCOUNTER — Observation Stay
Admission: EM | Admit: 2021-04-11 | Discharge: 2021-04-12 | Disposition: A | Discharge status: Home / Self Care | Payer: Medicare Other | Attending: Internal Medicine | Admitting: Internal Medicine

## 2021-04-11 DIAGNOSIS — F419 Anxiety disorder, unspecified: Secondary | ICD-10-CM | POA: Insufficient documentation

## 2021-04-11 DIAGNOSIS — G40909 Epilepsy, unspecified, not intractable, without status epilepticus: Secondary | ICD-10-CM

## 2021-04-11 DIAGNOSIS — Z20822 Contact with and (suspected) exposure to covid-19: Secondary | ICD-10-CM | POA: Insufficient documentation

## 2021-04-11 DIAGNOSIS — Z79899 Other long term (current) drug therapy: Secondary | ICD-10-CM | POA: Insufficient documentation

## 2021-04-11 DIAGNOSIS — R918 Other nonspecific abnormal finding of lung field: Secondary | ICD-10-CM | POA: Diagnosis not present

## 2021-04-11 DIAGNOSIS — Y900 Blood alcohol level of less than 20 mg/100 ml: Secondary | ICD-10-CM | POA: Diagnosis not present

## 2021-04-11 DIAGNOSIS — R9389 Abnormal findings on diagnostic imaging of other specified body structures: Secondary | ICD-10-CM

## 2021-04-11 DIAGNOSIS — Z9114 Patient's other noncompliance with medication regimen: Secondary | ICD-10-CM

## 2021-04-11 DIAGNOSIS — F17210 Nicotine dependence, cigarettes, uncomplicated: Secondary | ICD-10-CM | POA: Insufficient documentation

## 2021-04-11 DIAGNOSIS — R569 Unspecified convulsions: Principal | ICD-10-CM | POA: Insufficient documentation

## 2021-04-11 DIAGNOSIS — I10 Essential (primary) hypertension: Secondary | ICD-10-CM | POA: Insufficient documentation

## 2021-04-11 LAB — CBC
HCT: 39.2 % (ref 39.0–52.0)
Hemoglobin: 12.8 g/dL — ABNORMAL LOW (ref 13.0–17.0)
MCH: 29.4 pg (ref 26.0–34.0)
MCHC: 32.7 g/dL (ref 30.0–36.0)
MCV: 89.9 fL (ref 80.0–100.0)
Platelets: 315 10*3/uL (ref 150–400)
RBC: 4.36 MIL/uL (ref 4.22–5.81)
RDW: 13.9 % (ref 11.5–15.5)
WBC: 13.5 10*3/uL — ABNORMAL HIGH (ref 4.0–10.5)
nRBC: 0 % (ref 0.0–0.2)

## 2021-04-11 LAB — VALPROIC ACID LEVEL: Valproic Acid Lvl: 13 ug/mL — ABNORMAL LOW (ref 50.0–100.0)

## 2021-04-11 LAB — BASIC METABOLIC PANEL
Anion gap: 24 — ABNORMAL HIGH (ref 5–15)
BUN: 12 mg/dL (ref 6–20)
CO2: 14 mmol/L — ABNORMAL LOW (ref 22–32)
Calcium: 9.3 mg/dL (ref 8.9–10.3)
Chloride: 104 mmol/L (ref 98–111)
Creatinine, Ser: 0.99 mg/dL (ref 0.61–1.24)
GFR, Estimated: >60 mL/min (ref >60)
Glucose, Bld: 140 mg/dL — ABNORMAL HIGH (ref 70–99)
Potassium: 3.2 mmol/L — ABNORMAL LOW (ref 3.5–5.1)
Sodium: 142 mmol/L (ref 135–145)

## 2021-04-11 LAB — ETHANOL: Alcohol, Ethyl (B): <10 mg/dL (ref <10)

## 2021-04-11 MED ORDER — DIVALPROEX SODIUM 500 MG PO DR TAB
500.0000 mg | DELAYED_RELEASE_TABLET | Freq: Once | ORAL | Status: AC
  Administered 2021-04-11: 500 mg via ORAL
  Filled 2021-04-11: qty 1

## 2021-04-11 MED ORDER — PREGABALIN 75 MG PO CAPS
150.0000 mg | ORAL_CAPSULE | Freq: Once | ORAL | Status: AC
  Administered 2021-04-12: 150 mg via ORAL
  Filled 2021-04-11: qty 2

## 2021-04-11 MED ORDER — SODIUM CHLORIDE 0.9 % IV BOLUS
1000.0000 mL | Freq: Once | INTRAVENOUS | Status: AC
  Administered 2021-04-12: 1000 mL via INTRAVENOUS

## 2021-04-11 MED ORDER — LORAZEPAM 2 MG/ML IJ SOLN
1.0000 mg | Freq: Once | INTRAMUSCULAR | Status: AC
  Administered 2021-04-11: 1 mg via INTRAVENOUS
  Filled 2021-04-11: qty 1

## 2021-04-11 MED ORDER — LAMOTRIGINE 25 MG PO TABS
25.0000 mg | ORAL_TABLET | Freq: Once | ORAL | Status: AC
  Administered 2021-04-12: 25 mg via ORAL
  Filled 2021-04-11: qty 1

## 2021-04-11 NOTE — ED Triage Notes (Addendum)
Pt arrived via EMS from home with report of seizures, unknown how many or when last occurred today. EMS reports pt has not had medication in days. When writer asking questions, pt not answering questions appropriately and keeps saying, "fuck, Fuck, Fuck man." Pt involuntarily jerking in wheelchair and curses each time a jerk like movement occurs.   Below hx obtained by reviewing back chart as of 02/2021 Keppra was recently switched to Lamictal by his neurologist. Lamictal 25mg p.o. twice daily(PTA dose) Depakote DR 500 mg BID as per neurology gabapentin 600 mg TID pregabalinPTADose   First RN called to triage room due to pt stating, "im gonna have another one, man, shit."

## 2021-04-11 NOTE — ED Notes (Addendum)
Telesitter placed at bedside. This RN spoke with telesitter staff, report given, advising pt visualized.

## 2021-04-11 NOTE — ED Provider Notes (Signed)
-----------------------------------------   11:23 PM on 04/11/2021 -----------------------------------------  Assuming care from Dr. Fuller Plan.  In short, Nicholas Delgado is a 40 y.o. male with a chief complaint of seizures.  Refer to the original H&P for additional details.  The current plan of care is to admit after CT results are available.   ----------------------------------------- 12:37 AM on 04/12/2021 -----------------------------------------  CT head and c-spines are normal.  Discussed case with Dr. Para March by [facility] secure chat and she will admit.   Loleta Rose, MD 04/12/21 [phone]

## 2021-04-11 NOTE — ED Provider Notes (Signed)
[facility] Emergency Department Provider Note  ____________________________________________   Event Date/Time   First MD Initiated Contact with Patient 04/11/21 2158     (approximate)  I have reviewed the triage vital signs and the nursing notes.   HISTORY  Chief Complaint Seizures    HPI Nicholas Delgado is a 40 y.o. male seizures who comes in for seizure.  Patient comes in for seizure.  Patient was in the waiting room and had a witnessed seizure by the nurse.  They report it was tonic-clonic and patient did go below the face and last about 45 seconds.  Afterwards patient was postictal and not physically aggressive but kept repeating" F U).  I did call patient's wife who states that he has been compliant with his seizure medicines but ran out of his blood pressure medicines recently.  She also is concerned that sometimes he over takes the Lyrica and then he runs out of the Lyrica is when he starts having the seizures.   Unable to get full HPI from patient due to being postictal   Below hx obtained by reviewing back chart as of 02/2021 Keppra was recently switched to Lamictal by his neurologist. Lamictal 25mg p.o. twice daily(PTA dose) Depakote DR 500mg  BIDas per neurology gabapentin 600mg  TID pregabalinPTADose     Past Medical History:  Diagnosis Date  . Hypertension   . Seizures [facility])     Patient Active Problem List   Diagnosis Date Noted  . Seizures (HCC) 03/05/2021  . Seizure (HCC) 11/24/2020  . Tobacco use disorder 08/25/2015  . Opioid use with withdrawal (HCC) 08/25/2015  . 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 HTN (hypertension) 08/25/2015  . Dyslipidemia 08/25/2015  . Opioid use disorder, severe, dependence (HCC) 08/24/2015    Past Surgical History:  Procedure Laterality Date  . arm surgery Left     Prior to Admission medications   Medication Sig Start Date End Date  Taking? Authorizing Provider  amitriptyline (ELAVIL) 25 MG tablet Take 1 tablet (25 mg total) by mouth at bedtime. 03/07/21   10/25/2015, DO  atorvastatin (LIPITOR) 20 MG tablet Take 20 mg by mouth at bedtime.     [provider]  carvedilol (COREG) 6.25 MG tablet Take 6.25 mg by mouth 2 (two) times daily with a meal.    [provider]  divalproex (DEPAKOTE) 500 MG DR tablet Take 1 tablet (500 mg total) by mouth every 12 (twelve) hours. 03/07/21   03/09/21 A, DO  escitalopram (LEXAPRO) 20 MG tablet Take 1 tablet (20 mg total) by mouth daily. 03/07/21   03/09/21, DO  folic acid (FOLVITE) 1 MG tablet Take 1 mg by mouth daily. 12/11/20 12/11/21  [provider]  gabapentin (NEURONTIN) 600 MG tablet Take 1 tablet (600 mg total) by mouth 3 (three) times daily. 03/07/21   12/13/20, DO  lamoTRIgine (LAMICTAL) 25 MG tablet Take 1 tablet (25 mg total) by mouth 2 (two) times daily. 03/07/21   03/09/21, DO  lisinopril (PRINIVIL,ZESTRIL) 20 MG tablet Take 20 mg by mouth daily.    [provider]  methotrexate 2.5 MG tablet Take 6 tablets by mouth every 7 (seven) days. Sunday 02/12/21   [provider]  pregabalin (LYRICA) 150 MG capsule Take 1 capsule (150 mg total) by mouth 3 (three) times daily. 03/07/21   Friday, DO  pregabalin (LYRICA) 50 MG capsule Take 1 capsule (50 mg  total) by mouth at bedtime. 03/07/21   Esaw Grandchild A, DO  QUEtiapine (SEROQUEL) 50 MG tablet Take by mouth. Take 50 mg by mouth at bedtime for 1 week, then 2 tablets by mouth at bedtime for 1 week, then 3 tablets by mouth at bedtime for 1 week 02/16/21   [provider]  SUBOXONE 4-1 MG FILM Place 1 Film under the tongue in the morning, at noon, and at bedtime. 10/01/20   [provider]  traZODone (DESYREL) 50 MG tablet Take 1-2 tablets (50-100 mg total) by mouth at bedtime. 03/07/21   Pennie Banter, DO    Allergies Sulfa  antibiotics  History reviewed. No pertinent family history.  Social History Social History   Tobacco Use  . Smoking status: Current Every Day Smoker    Packs/day: 2.00  . Smokeless tobacco: Never Used  Substance Use Topics  . Alcohol use: No  . Drug use: Not Currently    Comment: percocet 10mg       Review of Systems Unable to get full review of systems from patient due to post being postictal _____________________   PHYSICAL EXAM:  VITAL SIGNS: ED Triage Vitals [04/11/21 2147]  Enc Vitals Group     BP (!) 133/91     Pulse Rate 82     Resp 18     Temp 99 F (37.2 C)     Temp Source Oral     SpO2 99 %     Weight      Height      Head Circumference      Peak Flow      Pain Score      Pain Loc      Pain Edu?      Excl. in GC?     Constitutional: Altered, confused, yelling Eyes: Conjunctivae are normal. EOMI. Head: Atraumatic. Nose: No congestion/rhinnorhea. Mouth/Throat: Mucous membranes are moist.   Neck: No stridor. Trachea Midline. FROM Cardiovascular: Normal rate, regular rhythm. Grossly normal heart sounds.  Good peripheral circulation. Respiratory: Normal respiratory effort.  No retractions. Lungs CTAB. Gastrointestinal: Soft and nontender. No distention. No abdominal bruits.  Musculoskeletal: No lower extremity tenderness nor edema.  No joint effusions. Neurologic: Altered, confused, able to wiggle bilateral toes and move bilateral hands.  No obvious deficits Skin:  Skin is warm, dry and intact. No rash noted. Psychiatric: Mood and affect are normal. Speech and behavior are normal. GU: Deferred   ____________________________________________   LABS (all labs ordered are listed, but only abnormal results are displayed)  Labs Reviewed  CBC - Abnormal; Notable for the following components:      Result Value   WBC 13.5 (*)    Hemoglobin 12.8 (*)    All other components within normal limits  BASIC METABOLIC PANEL - Abnormal; Notable for the  following components:   Potassium 3.2 (*)    CO2 14 (*)    Glucose, Bld 140 (*)    Anion gap 24 (*)    All other components within normal limits  VALPROIC ACID LEVEL - Abnormal; Notable for the following components:   Valproic Acid Lvl 13 (*)    All other components within normal limits  RESP PANEL BY RT-PCR (FLU A&B, COVID) ARPGX2  ETHANOL  URINE DRUG SCREEN, QUALITATIVE ([facility] ONLY)  LAMOTRIGINE LEVEL   ____________________________________________   ED ECG REPORT I, 04/13/21, the attending physician, personally viewed and interpreted this ECG.  Sinus tachycardia rate of 112, no ST elevation, no T  wave versions, normal intervals ____________________________________________  RADIOLOGY Vela Prose, personally viewed and evaluated these images (plain radiographs) as part of my medical decision making, as well as reviewing the written report by the radiologist.  ED MD interpretation:  Pending   Official radiology report(s): No results found.  ____________________________________________   PROCEDURES  Procedure(s) performed (including Critical Care):  .1-3 Lead EKG Interpretation Performed by: Concha Se, MD Authorized by: Concha Se, MD     Interpretation: abnormal     ECG rate:  100s    ECG rate assessment: tachycardic     Rhythm: sinus tachycardia     Ectopy: none     Conduction: normal       ____________________________________________   INITIAL IMPRESSION / ASSESSMENT AND PLAN / ED COURSE  Nicholas Delgado was evaluated in Emergency Department on 04/11/2021 for the symptoms described in the history of present illness. He was evaluated in the context of the global COVID-19 pandemic, which necessitated consideration that the patient might be at risk for infection with the SARS-CoV-2 virus that causes COVID-19. Institutional protocols and algorithms that pertain to the evaluation of patients at risk for COVID-19 are in a state of rapid change based  on information released by regulatory bodies including the CDC and federal and state organizations. These policies and algorithms were followed during the patient's care in the ED.    Patient is a 40 year old who comes in with seizures.  Patient was verbally aggressive and postictal so 1 mg of IV Ativan was given.  Afterwards patient was more compliant.  Will get CT scan to evaluate for intercranial hemorrhage or cervical fracture given unclear if patient hit his head during the seizures.  Labs ordered to evaluate for Electra MIs, AKI.  We will keep patient on the cardiac monitor to evaluate for respiratory decompensation  Labs show elevated anion gap mostly from the seizures.  We will give some fluids.  Patient was given his dose of Lamictal, Depakote and pregabalin.  His Depakote level was subtherapeutic so I suspect that this is secondary to him not taking his medications.  Hit patient had up to oncoming team pending CT imaging and admission to the hospital       ____________________________________________   FINAL CLINICAL IMPRESSION(S) / ED DIAGNOSES   Final diagnoses:  Seizure (HCC)      MEDICATIONS GIVEN DURING THIS VISIT:  Medications  lamoTRIgine (LAMICTAL) tablet 25 mg (has no administration in time range)  pregabalin (LYRICA) capsule 150 mg (has no administration in time range)  sodium chloride 0.9 % bolus 1,000 mL (has no administration in time range)  LORazepam (ATIVAN) injection 1 mg (1 mg Intravenous Given 04/11/21 2217)  divalproex (DEPAKOTE) DR tablet 500 mg (500 mg Oral Given 04/11/21 2340)     ED Discharge Orders    None       Note:  This document was prepared using Dragon voice recognition software and may include unintentional dictation errors.   Concha Se, MD 04/11/21 2351

## 2021-04-11 NOTE — ED Notes (Signed)
Seizure like activity observed where pt locked body into a straight position with head backwards toward to right side with right arm extended outward. Pt gray and ashen in color while non-re breather mask set up placed onto pt. Pt with secretions noted at the corner of mouth. Seizure like activity lasting approx 45 seconds. Second Charity fundraiser, Furniture conservator/restorer moved pt onto Doctor, general practice due to post ictal state.

## 2021-04-11 NOTE — ED Notes (Signed)
Pt brought to room 3 at this time by April RN, pt alert and speaking. Unable to recall seizure at this time. Speech clear, cursing at April RN.

## 2021-04-12 ENCOUNTER — Other Ambulatory Visit: Payer: Self-pay

## 2021-04-12 DIAGNOSIS — R569 Unspecified convulsions: Principal | ICD-10-CM

## 2021-04-12 DIAGNOSIS — Z9114 Patient's other noncompliance with medication regimen: Secondary | ICD-10-CM

## 2021-04-12 DIAGNOSIS — G40909 Epilepsy, unspecified, not intractable, without status epilepticus: Secondary | ICD-10-CM

## 2021-04-12 DIAGNOSIS — R9389 Abnormal findings on diagnostic imaging of other specified body structures: Secondary | ICD-10-CM

## 2021-04-12 LAB — BASIC METABOLIC PANEL
Anion gap: 10 (ref 5–15)
BUN: 6 mg/dL (ref 6–20)
CO2: 24 mmol/L (ref 22–32)
Calcium: 8.3 mg/dL — ABNORMAL LOW (ref 8.9–10.3)
Chloride: 109 mmol/L (ref 98–111)
Creatinine, Ser: 0.63 mg/dL (ref 0.61–1.24)
GFR, Estimated: >60 mL/min (ref >60)
Glucose, Bld: 104 mg/dL — ABNORMAL HIGH (ref 70–99)
Potassium: 3.3 mmol/L — ABNORMAL LOW (ref 3.5–5.1)
Sodium: 143 mmol/L (ref 135–145)

## 2021-04-12 LAB — CBC WITH DIFFERENTIAL/PLATELET
Abs Immature Granulocytes: 0.07 10*3/uL (ref 0.00–0.07)
Basophils Absolute: 0 10*3/uL (ref 0.0–0.1)
Basophils Relative: 0 %
Eosinophils Absolute: 0 10*3/uL (ref 0.0–0.5)
Eosinophils Relative: 0 %
HCT: 32 % — ABNORMAL LOW (ref 39.0–52.0)
Hemoglobin: 11.2 g/dL — ABNORMAL LOW (ref 13.0–17.0)
Immature Granulocytes: 1 %
Lymphocytes Relative: 26 %
Lymphs Abs: 1.5 10*3/uL (ref 0.7–4.0)
MCH: 29.9 pg (ref 26.0–34.0)
MCHC: 35 g/dL (ref 30.0–36.0)
MCV: 85.6 fL (ref 80.0–100.0)
Monocytes Absolute: 0.3 10*3/uL (ref 0.1–1.0)
Monocytes Relative: 6 %
Neutro Abs: 4 10*3/uL (ref 1.7–7.7)
Neutrophils Relative %: 67 %
Platelets: 208 10*3/uL (ref 150–400)
RBC: 3.74 MIL/uL — ABNORMAL LOW (ref 4.22–5.81)
RDW: 13.8 % (ref 11.5–15.5)
WBC: 6 10*3/uL (ref 4.0–10.5)
nRBC: 0 % (ref 0.0–0.2)

## 2021-04-12 LAB — RESP PANEL BY RT-PCR (FLU A&B, COVID) ARPGX2
Influenza A by PCR: NEGATIVE
Influenza B by PCR: NEGATIVE
SARS Coronavirus 2 by RT PCR: NEGATIVE

## 2021-04-12 LAB — URINE DRUG SCREEN, QUALITATIVE (ARMC ONLY)
Amphetamines, Ur Screen: NOT DETECTED
Barbiturates, Ur Screen: NOT DETECTED
Benzodiazepine, Ur Scrn: POSITIVE — AB
Cannabinoid 50 Ng, Ur ~~LOC~~: NOT DETECTED
Cocaine Metabolite,Ur ~~LOC~~: NOT DETECTED
MDMA (Ecstasy)Ur Screen: NOT DETECTED
Methadone Scn, Ur: NOT DETECTED
Opiate, Ur Screen: POSITIVE — AB
Phencyclidine (PCP) Ur S: NOT DETECTED
Tricyclic, Ur Screen: POSITIVE — AB

## 2021-04-12 MED ORDER — VALPROATE SODIUM 100 MG/ML IV SOLN
750.0000 mg | Freq: Once | INTRAVENOUS | Status: AC
  Administered 2021-04-12: 750 mg via INTRAVENOUS
  Filled 2021-04-12: qty 7.5

## 2021-04-12 MED ORDER — PREGABALIN 50 MG PO CAPS
100.0000 mg | ORAL_CAPSULE | Freq: Three times a day (TID) | ORAL | Status: DC
  Administered 2021-04-12 (×2): 100 mg via ORAL
  Filled 2021-04-12 (×2): qty 2

## 2021-04-12 MED ORDER — CARVEDILOL 6.25 MG PO TABS
6.2500 mg | ORAL_TABLET | Freq: Two times a day (BID) | ORAL | Status: DC
  Administered 2021-04-12 (×2): 6.25 mg via ORAL
  Filled 2021-04-12 (×2): qty 1

## 2021-04-12 MED ORDER — BUPRENORPHINE HCL-NALOXONE HCL 2-0.5 MG SL SUBL: 2.0000 | SUBLINGUAL_TABLET | Freq: Every day | SUBLINGUAL | Status: DC

## 2021-04-12 MED ORDER — LAMOTRIGINE 25 MG PO TABS
25.0000 mg | ORAL_TABLET | Freq: Two times a day (BID) | ORAL | 2 refills | Status: AC
Start: 2021-04-12 — End: ?

## 2021-04-12 MED ORDER — BUSPIRONE HCL 5 MG PO TABS: 5.0000 mg | ORAL_TABLET | Freq: Three times a day (TID) | ORAL | 0 refills | Status: AC

## 2021-04-12 MED ORDER — DIVALPROEX SODIUM 500 MG PO DR TAB: 500.0000 mg | DELAYED_RELEASE_TABLET | Freq: Two times a day (BID) | ORAL | 0 refills | Status: AC

## 2021-04-12 MED ORDER — BUPRENORPHINE HCL 2 MG SL SUBL
4.0000 mg | SUBLINGUAL_TABLET | Freq: Every day | SUBLINGUAL | Status: DC
Start: 2021-04-12 — End: 2021-04-12
  Administered 2021-04-12: 4 mg via SUBLINGUAL
  Filled 2021-04-12: qty 2

## 2021-04-12 MED ORDER — ATORVASTATIN CALCIUM 20 MG PO TABS: 20.0000 mg | ORAL_TABLET | Freq: Every day | ORAL | Status: DC

## 2021-04-12 MED ORDER — SODIUM CHLORIDE 0.9 % IV SOLN
75.0000 mL/h | INTRAVENOUS | Status: DC
  Administered 2021-04-12: 75 mL/h via INTRAVENOUS

## 2021-04-12 MED ORDER — FOLIC ACID 1 MG PO TABS
1.0000 mg | ORAL_TABLET | Freq: Every day | ORAL | Status: DC
  Administered 2021-04-12: 1 mg via ORAL
  Filled 2021-04-12: qty 1

## 2021-04-12 MED ORDER — ENOXAPARIN SODIUM 40 MG/0.4ML ~~LOC~~ SOLN: 40.0000 mg | SUBCUTANEOUS | Status: DC

## 2021-04-12 MED ORDER — LAMOTRIGINE 25 MG PO TABS
25.0000 mg | ORAL_TABLET | Freq: Two times a day (BID) | ORAL | Status: DC
  Filled 2021-04-12: qty 1

## 2021-04-12 MED ORDER — PREGABALIN 100 MG PO CAPS: 100.0000 mg | ORAL_CAPSULE | Freq: Three times a day (TID) | ORAL | 0 refills | Status: AC

## 2021-04-12 MED ORDER — ALPRAZOLAM 0.25 MG PO TABS
0.2500 mg | ORAL_TABLET | Freq: Three times a day (TID) | ORAL | Status: DC | PRN
  Administered 2021-04-12 (×2): 0.25 mg via ORAL
  Filled 2021-04-12 (×2): qty 1

## 2021-04-12 MED ORDER — LORAZEPAM 2 MG/ML IJ SOLN
1.0000 mg | INTRAMUSCULAR | Status: DC | PRN
  Administered 2021-04-12 (×2): 1 mg via INTRAVENOUS
  Filled 2021-04-12 (×2): qty 1

## 2021-04-12 MED ORDER — ACETAMINOPHEN 325 MG PO TABS
650.0000 mg | ORAL_TABLET | ORAL | Status: DC | PRN
  Administered 2021-04-12: 650 mg via ORAL
  Filled 2021-04-12: qty 2

## 2021-04-12 MED ORDER — VALPROATE SODIUM 100 MG/ML IV SOLN
500.0000 mg | Freq: Once | INTRAVENOUS | Status: AC
  Administered 2021-04-12: 500 mg via INTRAVENOUS
  Filled 2021-04-12: qty 5

## 2021-04-12 MED ORDER — ENOXAPARIN SODIUM 40 MG/0.4ML ~~LOC~~ SOLN
40.0000 mg | SUBCUTANEOUS | Status: DC
  Administered 2021-04-12: 40 mg via SUBCUTANEOUS
  Filled 2021-04-12: qty 0.4

## 2021-04-12 MED ORDER — ONDANSETRON HCL 4 MG PO TABS: 4.0000 mg | ORAL_TABLET | Freq: Every day | ORAL | 1 refills | Status: AC | PRN

## 2021-04-12 MED ORDER — ONDANSETRON HCL 4 MG PO TABS
4.0000 mg | ORAL_TABLET | Freq: Four times a day (QID) | ORAL | Status: DC | PRN
  Administered 2021-04-12: 4 mg via ORAL
  Filled 2021-04-12: qty 1

## 2021-04-12 MED ORDER — ACETAMINOPHEN 650 MG RE SUPP: 650.0000 mg | RECTAL | Status: DC | PRN

## 2021-04-12 MED ORDER — BUSPIRONE HCL 5 MG PO TABS
5.0000 mg | ORAL_TABLET | Freq: Three times a day (TID) | ORAL | Status: DC
  Administered 2021-04-12 (×2): 5 mg via ORAL
  Filled 2021-04-12 (×4): qty 1

## 2021-04-12 MED ORDER — ONDANSETRON HCL 4 MG/2ML IJ SOLN
4.0000 mg | Freq: Four times a day (QID) | INTRAMUSCULAR | Status: DC | PRN
  Administered 2021-04-12: 4 mg via INTRAVENOUS
  Filled 2021-04-12: qty 2

## 2021-04-12 MED ORDER — DIVALPROEX SODIUM 500 MG PO DR TAB
500.0000 mg | DELAYED_RELEASE_TABLET | Freq: Two times a day (BID) | ORAL | Status: DC
  Administered 2021-04-12: 500 mg via ORAL
  Filled 2021-04-12 (×3): qty 1

## 2021-04-12 MED ORDER — QUETIAPINE FUMARATE 25 MG PO TABS: 150.0000 mg | ORAL_TABLET | Freq: Every day | ORAL | Status: DC

## 2021-04-12 MED ORDER — LAMOTRIGINE 25 MG PO TABS
25.0000 mg | ORAL_TABLET | Freq: Two times a day (BID) | ORAL | Status: DC
  Administered 2021-04-12: 25 mg via ORAL
  Filled 2021-04-12 (×3): qty 1

## 2021-04-12 MED ORDER — LISINOPRIL 20 MG PO TABS
20.0000 mg | ORAL_TABLET | Freq: Every day | ORAL | Status: DC
  Administered 2021-04-12: 20 mg via ORAL
  Filled 2021-04-12: qty 1

## 2021-04-12 NOTE — Discharge Instructions (Signed)
Seizure, Adult A seizure is a sudden burst of abnormal electrical and chemical activity in the brain. Seizures usually last from 30 seconds to 2 minutes.  What are the causes? Common causes of this condition include:  Fever or infection.  Problems that affect the brain. These may include: ? A brain or head injury. ? Bleeding in the brain. ? A brain tumor.  Low levels of blood sugar or salt.  Kidney problems or liver problems.  Conditions that are passed from parent to child (are inherited).  Problems with a substance, such as: ? Having a reaction to a drug or a medicine. ? Stopping the use of a substance all of a sudden (withdrawal).  A stroke.  Disorders that affect how you develop. Sometimes, the cause may not be known.  What increases the risk?  Having someone in your family who has epilepsy. In this condition, seizures happen again and again over time. They have no clear cause.  Having had a tonic-clonic seizure before. This type of seizure causes you to: ? Tighten the muscles of the whole body. ? Lose consciousness.  Having had a head injury or strokes before.  Having had a lack of oxygen at birth. What are the signs or symptoms? There are many types of seizures. The symptoms vary depending on the type of seizure you have. Symptoms during a seizure  Shaking that you cannot control (convulsions) with fast, jerky movements of muscles.  Stiffness of the body.  Breathing problems.  Feeling mixed up (confused).  Staring or not responding to sound or touch.  Head nodding.  Eyes that blink, flutter, or move fast.  Drooling, grunting, or making clicking sounds with your mouth  Losing control of when you pee or poop. Symptoms before a seizure  Feeling afraid, nervous, or worried.  Feeling like you may vomit.  Feeling like: ? You are moving when you are not. ? Things around you are moving when they are not.  Feeling like you saw or heard something before  (dj vu).  Odd tastes or smells.  Changes in how you see. You may see flashing lights or spots. Symptoms after a seizure  Feeling confused.  Feeling sleepy.  Headache.  Sore muscles. How is this treated? If your seizure stops on its own, you will not need treatment. If your seizure lasts longer than 5 minutes, you will normally need treatment. Treatment may include:  Medicines given through an IV tube.  Avoiding things, such as medicines, that are known to cause your seizures.  Medicines to prevent seizures.  A device to prevent or control seizures.  Surgery.  A diet low in carbohydrates and high in fat (ketogenic diet). Follow these instructions at home: Medicines  Take over-the-counter and prescription medicines only as told by your doctor.  Avoid foods or drinks that may keep your medicine from working, such as alcohol. Activity  Follow instructions about driving, swimming, or doing things that would be dangerous if you had another seizure. Wait until your doctor says it is safe for you to do these things.  If you live in the [place] ask your local department of motor vehicles when you can drive.  Get a lot of rest. Teaching others  Teach friends and family what to do when you have a seizure. They should: ? Help you get down to the ground. ? Protect your head and body. ? Loosen any clothing around your neck. ? Turn you on your side. ? Know whether or not   you need emergency care. ? Stay with you until you are better.  Also, tell them what not to do if you have a seizure. Tell them: ? They should not hold you down. ? They should not put anything in your mouth.   General instructions  Avoid anything that gives you seizures.  Keep a seizure diary. Write down: ? What you remember about each seizure. ? What you think caused each seizure.  Keep all follow-up visits. Contact a doctor if:  You have another seizure or seizures. Call the doctor each time you  have a seizure.  The pattern of your seizures changes.  You keep having seizures with treatment.  You have symptoms of being sick or having an infection.  You are not able to take your medicine. Get help right away if:  You have any of these problems: ? A seizure that lasts longer than 5 minutes. ? Many seizures in a row and you do not feel better between seizures. ? A seizure that makes it harder to breathe. ? A seizure and you can no longer speak or use part of your body.  You do not wake up right after a seizure.  You get hurt during a seizure.  You feel confused or have pain right after a seizure. These symptoms may be an emergency. Get help right away. Call your local emergency services (911 in the [place]  Do not wait to see if the symptoms will go away.  Do not drive yourself to the hospital. Summary  A seizure is a sudden burst of abnormal electrical and chemical activity in the brain. Seizures normally last from 30 seconds to 2 minutes.  Causes of seizures include illness, injury to the head, low levels of blood sugar or salt, and certain conditions.  Most seizures will stop on their own in less than 5 minutes. Seizures that last longer than 5 minutes are a medical emergency and need treatment right away.  Many medicines are used to treat seizures. Take over-the-counter and prescription medicines only as told by your doctor. This information is not intended to replace advice given to you by your health care provider. Make sure you discuss any questions you have with your health care provider. Document Revised: 06/13/2020 Document Reviewed: 06/13/2020 [employer] Patient Education  2021 [employer]  

## 2021-04-12 NOTE — H&P (Signed)
History and Physical    Nicholas Delgado PNT:[id number] DOB: 1981-11-22 DOA: 04/11/2021  PCP: Forest Gleason, MD   Patient coming from: Home  I have personally briefly reviewed patient's old medical records in [facility]  Chief Complaint: Seizures  HPI: Nicholas Delgado is a 40 y.o. male with medical history significant for Seizure disorder and nicotine dependence admitted a month ago with breakthrough seizures, evaluated by neurology with medication adjustments who was brought into the emergency room after having several seizures at home and reportedly turning blue.  Was awake on arrival and woke up between his seizures at home.  Had another seizure on arrival to the emergency room.  Patient has been reportedly noncompliant with his medication.  Had an EEG when he was here a month ago in March 2022 that was negative.  No seizure activity occurred during that admission.  He was discharged on Lamictal 25 twice daily, Depakote DR 500 twice daily, gabapentin 600 3 times daily with neurology follow-up. ED course: On arrival, temp 99, BP 133/91, pulse 82 with O2 sat 93% on room air.  Blood work significant for mild leukocytosis of 13,500, potassium 3.2.  Bicarb 14 with anion gap of 24.  Depakote level low at 13.  UDS positive for tricyclic opiates and benzos EKG, personally viewed and interpreted: Sinus tachycardia at 112 with no acute ST-T wave changes Imaging: CT head and C-spine without acute injury Chest x-ray: Streaky left lower lobe opacity which may reflect atelectasis versus developing infiltrate  Patient was given his home oral meds of Depakote, Lamictal and Lyrica as well as IV Ativan during an active seizure and a fluid bolus.  Hospitalist consulted for admission.    Review of Systems: As per HPI otherwise all other systems on review of systems negative.    Past Medical History:  Diagnosis Date  . Hypertension   . Seizures (HCC)     Past Surgical History:  Procedure Laterality  Date  . arm surgery Left      reports that he has been smoking. He has been smoking about 2.00 packs per day. He has never used smokeless tobacco. He reports previous drug use. He reports that he does not drink alcohol.  Allergies  Allergen Reactions  . Sulfa Antibiotics Shortness Of Breath and Anaphylaxis    OTHER REACTION    History reviewed. No pertinent family history.    Prior to Admission medications   Medication Sig Start Date End Date Taking? Authorizing Provider  amitriptyline (ELAVIL) 25 MG tablet Take 1 tablet (25 mg total) by mouth at bedtime. 03/07/21   Pennie Banter, DO  atorvastatin (LIPITOR) 20 MG tablet Take 20 mg by mouth at bedtime.     [provider]  carvedilol (COREG) 6.25 MG tablet Take 6.25 mg by mouth 2 (two) times daily with a meal.    [provider]  divalproex (DEPAKOTE) 500 MG DR tablet Take 1 tablet (500 mg total) by mouth every 12 (twelve) hours. 03/07/21   Esaw Grandchild A, DO  escitalopram (LEXAPRO) 20 MG tablet Take 1 tablet (20 mg total) by mouth daily. 03/07/21   Pennie Banter, DO  folic acid (FOLVITE) 1 MG tablet Take 1 mg by mouth daily. 12/11/20 12/11/21  [provider]  gabapentin (NEURONTIN) 600 MG tablet Take 1 tablet (600 mg total) by mouth 3 (three) times daily. 03/07/21   Pennie Banter, DO  lamoTRIgine (LAMICTAL) 25 MG tablet Take 1 tablet (25 mg total) by mouth  2 (two) times daily. 03/07/21   Pennie Banter, DO  lisinopril (PRINIVIL,ZESTRIL) 20 MG tablet Take 20 mg by mouth daily.    [provider]  methotrexate 2.5 MG tablet Take 6 tablets by mouth every 7 (seven) days. Sunday 02/12/21   [provider]  pregabalin (LYRICA) 150 MG capsule Take 1 capsule (150 mg total) by mouth 3 (three) times daily. 03/07/21   Pennie Banter, DO  pregabalin (LYRICA) 50 MG capsule Take 1 capsule (50 mg total) by mouth at bedtime. 03/07/21   Esaw Grandchild A, DO  QUEtiapine (SEROQUEL) 50 MG tablet  Take by mouth. Take 50 mg by mouth at bedtime for 1 week, then 2 tablets by mouth at bedtime for 1 week, then 3 tablets by mouth at bedtime for 1 week 02/16/21   [provider]  SUBOXONE 4-1 MG FILM Place 1 Film under the tongue in the morning, at noon, and at bedtime. 10/01/20   [provider]  traZODone (DESYREL) 50 MG tablet Take 1-2 tablets (50-100 mg total) by mouth at bedtime. 03/07/21   Pennie Banter, DO    Physical Exam: Vitals:   04/11/21 2215 04/11/21 2230 04/11/21 2245 04/11/21 2345  BP:  (!) 148/88  130/89  Pulse: (!) 108 (!) 102 (!) 103 85  Resp: 19 (!) 23 13 18   Temp:      TempSrc:      SpO2: 97% 97% 100% 100%     Vitals:   04/11/21 2215 04/11/21 2230 04/11/21 2245 04/11/21 2345  BP:  (!) 148/88  130/89  Pulse: (!) 108 (!) 102 (!) 103 85  Resp: 19 (!) 23 13 18   Temp:      TempSrc:      SpO2: 97% 97% 100% 100%      Constitutional: Alert and oriented x 3 . Not in any apparent distress HEENT:      Head: Normocephalic and atraumatic.         Eyes: PERLA, EOMI, Conjunctivae are normal. Sclera is non-icteric.       Mouth/Throat: Mucous membranes are moist.       Neck: Supple with no signs of meningismus. Cardiovascular: tachycardia. No murmurs, gallops, or rubs. 2+ symmetrical distal pulses are present . No JVD. No LE edema Respiratory: Respiratory effort normal .Lungs sounds clear bilaterally. No wheezes, crackles, or rhonchi.  Gastrointestinal: Soft, non tender, and non distended with positive bowel sounds.  Genitourinary: No CVA tenderness. Musculoskeletal: Nontender with normal range of motion in all extremities. No cyanosis, or erythema of extremities. Neurologic:  Face is symmetric. Moving all extremities. No gross focal neurologic deficits . Skin: Skin is warm, dry.  No rash or ulcers Psychiatric: Mood and affect are normal    Labs on Admission: I have personally reviewed following labs and imaging studies  CBC: Recent Labs  Lab  04/11/21 2205  WBC 13.5*  HGB 12.8*  HCT 39.2  MCV 89.9  PLT 315   Basic Metabolic Panel: Recent Labs  Lab 04/11/21 2205  NA 142  K 3.2*  CL 104  CO2 14*  GLUCOSE 140*  BUN 12  CREATININE 0.99  CALCIUM 9.3   GFR: CrCl cannot be calculated (Unknown ideal weight.). Liver Function Tests: No results for input(s): AST, ALT, ALKPHOS, BILITOT, PROT, ALBUMIN in the last 168 hours. No results for input(s): LIPASE, AMYLASE in the last 168 hours. No results for input(s): AMMONIA in the last 168 hours. Coagulation Profile: No results for input(s): INR, PROTIME in  the last 168 hours. Cardiac Enzymes: No results for input(s): CKTOTAL, CKMB, CKMBINDEX, TROPONINI in the last 168 hours. BNP (last 3 results) No results for input(s): PROBNP in the last 8760 hours. HbA1C: No results for input(s): HGBA1C in the last 72 hours. CBG: No results for input(s): GLUCAP in the last 168 hours. Lipid Profile: No results for input(s): CHOL, HDL, LDLCALC, TRIG, CHOLHDL, LDLDIRECT in the last 72 hours. Thyroid Function Tests: No results for input(s): TSH, T4TOTAL, FREET4, T3FREE, THYROIDAB in the last 72 hours. Anemia Panel: No results for input(s): VITAMINB12, FOLATE, FERRITIN, TIBC, IRON, RETICCTPCT in the last 72 hours. Urine analysis:    Component Value Date/Time   COLORURINE YELLOW 01/25/2021 0205   APPEARANCEUR CLEAR 01/25/2021 0205   APPEARANCEUR Clear 07/28/2014 1806   LABSPEC 1.025 01/25/2021 0205   LABSPEC 1.035 07/28/2014 1806   PHURINE 5.5 01/25/2021 0205   GLUCOSEU NEGATIVE 01/25/2021 0205   GLUCOSEU Negative 07/28/2014 1806   HGBUR NEGATIVE 01/25/2021 0205   BILIRUBINUR NEGATIVE 01/25/2021 0205   BILIRUBINUR Negative 07/28/2014 1806   KETONESUR TRACE (A) 01/25/2021 0205   PROTEINUR 30 (A) 01/25/2021 0205   NITRITE NEGATIVE 01/25/2021 0205   LEUKOCYTESUR NEGATIVE 01/25/2021 0205   LEUKOCYTESUR Negative 07/28/2014 1806    Radiological Exams on Admission: CT Head Wo  Contrast  Result Date: 04/11/2021 CLINICAL DATA:  Seizure with minor head trauma. EXAM: CT HEAD WITHOUT CONTRAST CT CERVICAL SPINE WITHOUT CONTRAST TECHNIQUE: Multidetector CT imaging of the head and cervical spine was performed following the standard protocol without intravenous contrast. Multiplanar CT image reconstructions of the cervical spine were also generated. COMPARISON:  March 05, 2021 and January 03, 2021. FINDINGS: CT HEAD FINDINGS Brain: No evidence of acute infarction, hemorrhage, hydrocephalus, extra-axial collection or mass lesion/mass effect. Vascular: No hyperdense vessel or unexpected calcification. Skull: Normal. Negative for fracture or focal lesion. Sinuses/Orbits: Paranasal sinuses are predominantly clear. Chronic right mastoid and middle ear effusion. Left mastoid is clear. Other: None. CT CERVICAL SPINE FINDINGS Alignment: Unchanged reversal of the normal cervical lordosis centered at C6. No evidence of traumatic listhesis. Skull base and vertebrae: No acute fracture. No primary bone lesion or focal pathologic process. Soft tissues and spinal canal: No prevertebral fluid or swelling. No visible canal hematoma. Disc levels: Mild loss of disc space height with endplate spurring at C6-C7. Remainder of the disc spaces are preserved. Upper chest: Unremarkable. Other: None IMPRESSION: 1. No evidence of acute intracranial pathology. 2. No evidence of acute fracture or traumatic listhesis of the cervical spine. 3. Chronic right mastoid and middle ear effusion. 4. Mild degenerative disc disease at C6-C7. Electronically Signed   By: Maudry MayhewJeffrey  Waltz MD   On: 04/11/2021 23:43   CT Cervical Spine Wo Contrast  Result Date: 04/11/2021 CLINICAL DATA:  Seizure with minor head trauma. EXAM: CT HEAD WITHOUT CONTRAST CT CERVICAL SPINE WITHOUT CONTRAST TECHNIQUE: Multidetector CT imaging of the head and cervical spine was performed following the standard protocol without intravenous contrast. Multiplanar CT  image reconstructions of the cervical spine were also generated. COMPARISON:  March 05, 2021 and January 03, 2021. FINDINGS: CT HEAD FINDINGS Brain: No evidence of acute infarction, hemorrhage, hydrocephalus, extra-axial collection or mass lesion/mass effect. Vascular: No hyperdense vessel or unexpected calcification. Skull: Normal. Negative for fracture or focal lesion. Sinuses/Orbits: Paranasal sinuses are predominantly clear. Chronic right mastoid and middle ear effusion. Left mastoid is clear. Other: None. CT CERVICAL SPINE FINDINGS Alignment: Unchanged reversal of the normal cervical lordosis centered at C6. No evidence of  traumatic listhesis. Skull base and vertebrae: No acute fracture. No primary bone lesion or focal pathologic process. Soft tissues and spinal canal: No prevertebral fluid or swelling. No visible canal hematoma. Disc levels: Mild loss of disc space height with endplate spurring at C6-C7. Remainder of the disc spaces are preserved. Upper chest: Unremarkable. Other: None IMPRESSION: 1. No evidence of acute intracranial pathology. 2. No evidence of acute fracture or traumatic listhesis of the cervical spine. 3. Chronic right mastoid and middle ear effusion. 4. Mild degenerative disc disease at C6-C7. Electronically Signed   By: Maudry Mayhew MD   On: 04/11/2021 23:43   DG Chest Portable 1 View  Result Date: 04/11/2021 CLINICAL DATA:  Shortness of breath, cough, seizure EXAM: PORTABLE CHEST 1 VIEW COMPARISON:  October 05, 2017 FINDINGS: Borderline enlarged cardiac silhouette, likely accentuated by technique. Streaky left lower lobe opacity. No pleural effusion. No pneumothorax. The visualized skeletal structures are unremarkable. IMPRESSION: Streaky left lower lobe opacity, which may reflect atelectasis versus developing infiltrate. Electronically Signed   By: Maudry Mayhew MD   On: 04/11/2021 23:46     Assessment/Plan 40 year old male with a history of seizure disorder with  questionable medication compliance presenting with recurrent seizures    Recurrent seizures (HCC)   Subtherapeutic Depakote level - Patient with a few seizures at home as well as a witnessed seizure in the emergency room - Depakote level at 15, subtherapeutic - Given home oral meds in the emergency room - IV Depakote load and IV Ativan as needed seizures - Patient had a negative EEG in March 2022 - Fall seizure and aspiration precautions - Neurology consult in the a.m.    Abnormal CXR - Chest x-ray showing possible left lower lobe infiltrate.  Also had mild leukocytosis - Monitor for respiratory symptoms given possibility of aspiration with seizures    DVT prophylaxis: Lovenox  Code Status: full code  Family Communication:  none  Disposition Plan: Back to previous home environment Consults called: Neurology Status:At the time of admission, it appears that the appropriate admission status for this patient is INPATIENT. This is judged to be reasonable and necessary in order to provide the required intensity of service to ensure the patient's safety given the presenting symptoms, physical exam findings, and initial radiographic and laboratory data in the context of their  Comorbid conditions.   Patient requires inpatient status due to high intensity of service, high risk for further deterioration and high frequency of surveillance required.   I certify that at the point of admission it is my clinical judgment that the patient will require inpatient hospital care spanning beyond 2 midnights     Andris Baumann MD [facility]     04/12/2021, 12:52 AM

## 2021-04-12 NOTE — Plan of Care (Signed)

## 2021-04-12 NOTE — Care Management CC44 (Signed)
Condition Code 44 Documentation Completed  Patient Details  Name: AJ CRUNKLETON MRN: [id number] Date of Birth: [birth year]   Condition Code 44 given:  Yes Patient signature on Condition Code 44 notice:  Yes Documentation of 2 MD's agreement:  Yes Code 44 added to claim:  Yes    Bing Quarry, RN 04/12/2021, 3:23 PM

## 2021-04-12 NOTE — ED Notes (Signed)
Safety sitter camera cut off. RN attempted to pug camera into all outlets in room without success. Tele-sitter made aware of problem. Pt requesting nausea and anxiety medications.

## 2021-04-12 NOTE — ED Notes (Signed)
This RN to bedside, introduced self to patient, blood work collected by this RN and sent to lab, medications administered as ordered. Pt tolerated well. Pt c/o severe anxiety at this time, repeatedly requesting medication for anxiety, explained will review chart. Pt states understanding.

## 2021-04-12 NOTE — ED Notes (Signed)
This RN spoke with Admitting MD Georgeann Oppenheim regarding patient's request for anxiety medication, per admitting MD okay to give 1mg  IV Ativan for anxiety at this time.

## 2021-04-12 NOTE — Progress Notes (Signed)
Brief hospitalist update note.  This is a nonbillable note.  Please see same-day H&P from Dr. Para March for full billable details.  Briefly, this is a 40 year old male with known epilepsy who presents for evaluation of breakthrough seizures.  Etiology of seizure is likely due to medication nonadherence.  On my interview the patient endorses not being able to pick up his meds since his previous hospitalization.  He does not have established primary care or outpatient doctors.  He also endorses severe anxiety and per bedside RN there was some concern about the violent behavior when he came in.  On my arrival he is calm and answers all my questions appropriately.  Received IV Depakote load in ED  Plan: Continue Depakote per home dose 500 mg p.o. every 12 hours Continue Lamictal per home dose 25 mg twice daily  Start BuSpar 5 mg 3 times daily for generalized anxiety As needed low-dose as needed Xanax Telemetry sitter ordered  Neurology has apparently been consulted.  Will await their recommendations for further management.  Lolita Patella MD

## 2021-04-12 NOTE — TOC Progression Note (Addendum)
Transition of Care [facility]) - Progression Note    Patient Details  Name: Nicholas Delgado MRN: [id number] Date of Birth: 02-Aug-1981  Transition of Care [facility]) CM/SW Contact  Bing Quarry, RN Phone Number: 04/12/2021, 3:29 PM  Clinical Narrative:   Patient status changed to Code-44. Documents completed, signed, and delivered to room by Unit RN. Open door clinic information/application, GoodRX card, and area resource (Purple Book) delivered to patient/patient's RN before discharge.  It was emphasized to seek out PCP in order to obtain ongoing refills on discharge medications for medication compliance/seizure prevention. Patient has Medicare A/B but he could not confirm financial ability to pay. Compliance seems related to not having a PCP for refills.  Patient became nauseous during explanation of Code-44. Unit RN reported to provider. Gabriel Cirri RN CM          Expected Discharge Plan and Services           Expected Discharge Date: 04/12/21                                     Social Determinants of Health (SDOH) Interventions    Readmission Risk Interventions No flowsheet data found.

## 2021-04-12 NOTE — ED Notes (Signed)
Patient ambulatory to bedside scale for accurate weight to Lovenox dosing. Gait steady.

## 2021-04-12 NOTE — ED Notes (Signed)
Admitting MD at bedside at this time.

## 2021-04-12 NOTE — Consult Note (Addendum)
Neurology Consultation  Reason for Consult: Breakthrough seizures Referring Physician: Dr. Para March, hospitalist  CC: Seizures  History is obtained from: Patient, chart  HPI: Nicholas Delgado is a 40 y.o. male past medical history of psoriasis, psoriatic arthritis and seizures-supposed to be on Depakote and Lamictal with noncompliance to medication, coming in for multiple seizures at home.  Reportedly he was back to baseline in between the seizures.  Patient does not remember what happened and what brought him into the hospital. He has been seen by my colleagues in March 2017 with breakthrough seizures in the setting of noncompliance.  In addition to his Lamictal, Depakote was added as there was also some concern for mood disorder. He sees Dr. Malvin Johns at [facility] neurolog[facility].  Lamictal was started by Dr. Daisy Blossom office after discontinuing Keppra. Pertaining to this visit-reportedly had multiple seizures at home with return to baseline between spells.  Admits noncompliance-was unable to fill his prescriptions and take his Depakote or Lamictal. Continues to report some twitching when he is lying in bed.  Reports that it is all over his body but mostly involves the left arm and then whole body.  He is unable to tell me how his seizures started and if he can tell me any inciting factors.  He has had spot EEGs and an extended ambulatory EEG of 2 hours and 30 minutes duration both of which were normal.  Outpatient neurologist recommended considering a 72-hour EEG-I think he would benefit from an EMU admission for better characterization of the spells.  Denies any current stressors  Reports that he had a headache when he came in which was improved with Tylenol.  Denies any preceding fevers chills.  Denies chest pain shortness of breath nausea vomiting.  Denies tongue biting or bowel bladder incontinence  Primary hospitalist also reports that the patient was on very high doses of pregabalin which she also  suddenly stopped taking.  ROS: Full ROS was performed and is negative except as noted in the HPI.  Past Medical History:  Diagnosis Date  . Hypertension   . Seizures (HCC)    History reviewed. No pertinent family history.  Social History:   reports that he has been smoking. He has been smoking about 2.00 packs per day. He has never used smokeless tobacco. He reports previous drug use. He reports that he does not drink alcohol.  Medications  Current Facility-Administered Medications:  .  0.9 %  sodium chloride infusion, 75 mL/hr, Intravenous, Continuous, Lindajo Royal V, MD, Last Rate: 75 mL/hr at 04/12/21 0124, 75 mL/hr at 04/12/21 0124 .  acetaminophen (TYLENOL) tablet 650 mg, 650 mg, Oral, Q4H PRN, 650 mg at 04/12/21 0908 **OR** acetaminophen (TYLENOL) suppository 650 mg, 650 mg, Rectal, Q4H PRN, Andris Baumann, MD .  ALPRAZolam Prudy Feeler) tablet 0.25 mg, 0.25 mg, Oral, TID PRN, Lolita Patella B, MD, 0.25 mg at 04/12/21 0909 .  atorvastatin (LIPITOR) tablet 20 mg, 20 mg, Oral, QHS, Sreenath, Sudheer B, MD .  buprenorphine (SUBUTEX) SL tablet 4 mg, 4 mg, Sublingual, Daily, Albina Billet, RPH, 4 mg at 04/12/21 0908 .  busPIRone (BUSPAR) tablet 5 mg, 5 mg, Oral, TID, Georgeann Oppenheim, Sudheer B, MD, 5 mg at 04/12/21 0959 .  carvedilol (COREG) tablet 6.25 mg, 6.25 mg, Oral, BID WC, Sreenath, Sudheer B, MD, 6.25 mg at 04/12/21 0909 .  divalproex (DEPAKOTE) DR tablet 500 mg, 500 mg, Oral, Q12H, Sreenath, Sudheer B, MD, 500 mg at 04/12/21 0959 .  enoxaparin (LOVENOX) injection 40 mg, 40  mg, Subcutaneous, Q24H, Otelia Sergeant, RPH, 40 mg at 04/12/21 2233 .  folic acid (FOLVITE) tablet 1 mg, 1 mg, Oral, Daily, Sreenath, Sudheer B, MD, 1 mg at 04/12/21 0909 .  lamoTRIgine (LAMICTAL) tablet 25 mg, 25 mg, Oral, BID, Georgeann Oppenheim, Sudheer B, MD, 25 mg at 04/12/21 0959 .  lisinopril (ZESTRIL) tablet 20 mg, 20 mg, Oral, Daily, Sreenath, Sudheer B, MD, 20 mg at 04/12/21 0908 .  LORazepam (ATIVAN)  injection 1-2 mg, 1-2 mg, Intravenous, Q2H PRN, Andris Baumann, MD, 1 mg at 04/12/21 0737 .  ondansetron (ZOFRAN) tablet 4 mg, 4 mg, Oral, Q6H PRN **OR** ondansetron (ZOFRAN) injection 4 mg, 4 mg, Intravenous, Q6H PRN, Andris Baumann, MD, 4 mg at 04/12/21 0316 .  pregabalin (LYRICA) capsule 100 mg, 100 mg, Oral, TID, Georgeann Oppenheim, Sudheer B, MD, 100 mg at 04/12/21 0908 .  QUEtiapine (SEROQUEL) tablet 150 mg, 150 mg, Oral, QHS, Sreenath, Sudheer B, MD  Exam: Current vital signs: BP (!) 138/98 (BP Location: Left Arm)   Pulse 81   Temp 98.3 F (36.8 C) (Oral)   Resp 18   Ht 6' (1.829 m)   Wt 90.3 kg   SpO2 100%   BMI 27.00 kg/m  Vital signs in last 24 hours: Temp:  [97.8 F (36.6 C)-99 F (37.2 C)] 98.3 F (36.8 C) (04/24 0905) Pulse Rate:  [74-108] 81 (04/24 0905) Resp:  [11-23] 18 (04/24 0905) BP: (106-148)/(75-98) 138/98 (04/24 0905) SpO2:  [95 %-100 %] 100 % (04/24 0905) Weight:  [90.3 kg] 90.3 kg (04/24 0226) GENERAL: Awake, alert in NAD HEENT: - Normocephalic and atraumatic, dry mm, no LN++, no Thyromegally LUNGS - Clear to auscultation bilaterally with no wheezes CV - S1S2 RRR, no m/r/g, equal pulses bilaterally. ABDOMEN - Soft, nontender, nondistended with normoactive BS Ext: warm, well perfused, intact peripheral pulses, no edema  NEURO:  Mental Status: AA&Ox3  Mildly diminished attention concentration Language: speech is nondysarthric.  Naming, repetition, fluency, and comprehension intact. Cranial Nerves: PERRL. EOMI, visual fields full, no facial asymmetry, facial sensation intact, hearing intact, tongue/uvula/soft palate midline, normal sternocleidomastoid and trapezius muscle strength. No evidence of tongue atrophy or fibrillations Motor: Antigravity 5/5 in all 4 extremities. Tone: is normal and bulk is normal Sensation- Intact to light touch bilaterally Coordination: FTN intact bilaterally, no ataxia in BLE. Gait- deferred  Labs I have reviewed labs in epic and  the results pertinent to this consultation are: CBC    Component Value Date/Time   WBC 6.0 04/12/2021 0722   RBC 3.74 (L) 04/12/2021 0722   HGB 11.2 (L) 04/12/2021 0722   HGB 14.1 07/28/2014 1806   HCT 32.0 (L) 04/12/2021 0722   HCT 40.2 07/28/2014 1806   PLT 208 04/12/2021 0722   PLT 211 07/28/2014 1806   MCV 85.6 04/12/2021 0722   MCV 95 07/28/2014 1806   MCH 29.9 04/12/2021 0722   MCHC 35.0 04/12/2021 0722   RDW 13.8 04/12/2021 0722   RDW 13.3 07/28/2014 1806   LYMPHSABS 1.5 04/12/2021 0722   LYMPHSABS 3.0 07/28/2014 1806   MONOABS 0.3 04/12/2021 0722   MONOABS 0.6 07/28/2014 1806   EOSABS 0.0 04/12/2021 0722   EOSABS 0.1 07/28/2014 1806   BASOSABS 0.0 04/12/2021 0722   BASOSABS 0.0 07/28/2014 1806    CMP     Component Value Date/Time   NA 143 04/12/2021 0722   NA 143 07/28/2014 1806   K 3.3 (L) 04/12/2021 0722   K 3.6 07/28/2014 1806   CL 109  04/12/2021 0722   CL 108 (H) 07/28/2014 1806   CO2 24 04/12/2021 0722   CO2 28 07/28/2014 1806   GLUCOSE 104 (H) 04/12/2021 0722   GLUCOSE 82 07/28/2014 1806   BUN 6 04/12/2021 0722   BUN 14 07/28/2014 1806   CREATININE 0.63 04/12/2021 0722   CREATININE 0.81 07/28/2014 1806   CALCIUM 8.3 (L) 04/12/2021 0722   CALCIUM 8.3 (L) 07/28/2014 1806   PROT 7.6 03/05/2021 0950   PROT 6.8 07/28/2014 1806   ALBUMIN 4.2 03/05/2021 0950   ALBUMIN 3.7 07/28/2014 1806   AST 31 03/05/2021 0950   AST 16 07/28/2014 1806   ALT 23 03/05/2021 0950   ALT 14 07/28/2014 1806   ALKPHOS 107 03/05/2021 0950   ALKPHOS 90 07/28/2014 1806   BILITOT 0.6 03/05/2021 0950   BILITOT 0.2 07/28/2014 1806   GFRNONAA >60 04/12/2021 0722   GFRNONAA >60 07/28/2014 1806   GFRAA >60 06/26/2020 2144   GFRAA >60 07/28/2014 1806  Depakote level subtherapeutic at 13 Lamictal level was not drawn  Imaging I have reviewed the images obtained: CT head and CT C-spine with no evidence of acute intracranial pathology, no evidence of acute fracture or traumatic  listhesis of the cervical spine.  Chronic right mastoid and right middle ear effusion.  Mild degenerative disc disease at C6-7 level.   Assessment:  40 year old with history of seizure-like episodes, initially on treatment with Keppra which was changed due to side effects, currently prescribed Depakote and Lamictal-came in with breakthrough seizures in the setting of noncompliance. He has been seen for breakthrough seizures in the setting of noncompliance last month as well. Per his history, no other identifiable factors that would have lowered his seizure threshold other than possible sudden cessation of pregabalin which she was presumably on 600 mg.  Exam not suggestive of any kind of underlying CNS infection.  Impression:  Likely breakthrough seizure in the setting of new medication noncompliance  Question of being on high doses of pregabalin that he suddenly stopped-could have lowered seizure threshold.  Recommendations:  Depakote that was started in March, remains subtherapeutic due to noncompliance. I would continue Depakote 500 twice daily but I will give him an additional dose of 750 mg (should target for level 80-100)   Continue Lamictal 25 mg tablet  BID - Lamictal needs to be uptitrated very carefully as below to achieve at least 100 BID dosing-faster up-titration puts him at very high risk for Trudie Buckler syndrome:  Week 1: 1 tablet each morning and 1 tablet each night this week.  Week 2: 1 tablet each morning and 2 tablets each night this week.  Week 3: 2 tablets each morning and 2 tablets each night this week.  Week 4: 2 tablets each morning and 3 tablets each night this week.  Week 5: 3 tablets each morning and 3 tablets each night this week.  Week 6: 3 tablets each morning and 4 tablets each night this week.  Week 7: 4 tablets each morning and 4 tablets each night this week.  Further titration of Lamictal under the care of outpatient neurology   Continue  current doses of pregabalin as well as antidepressants.   Seizure precautions as below   Outpatient EMU admission for characterization of the spells of seizure like episodes.   Follow-up with outpatient neurology in the next 2 weeks given multiple seizure-like episodes and coordination for possible EMU/long EEG admission.   I stressed the importance of compliance to medication multiple times and  he verbalized understanding.  Plan discussed with Dr. Georgeann OppenheimSreenath  -- Milon DikesAshish Thimothy Barretta, MD Neurologist [facility] Pager: [phone]  Seizure precautions Per [facility] [place] [facility] [place] statutes, patients with seizures are not allowed to drive until they have been seizure-free for six months.  Use caution when using heavy equipment or power tools. Avoid working on ladders or at heights. Take showers instead of baths. Ensure the water temperature is not too high on the home water heater. Do not go swimming alone. Do not lock yourself in a room alone (i.e. bathroom). When caring for infants or small children, sit down when holding, feeding, or changing them to minimize risk of injury to the child in the event you have a seizure. Maintain good sleep hygiene. Avoid alcohol.  If patient has another seizure, call 911 and bring them back to the ED if: A. The seizure lasts longer than 5 minutes.  B. The patient doesn't wake shortly after the seizure or has new problems such as difficulty seeing, speaking or moving following the seizure C. The patient was injured during the seizure D. The patient has a temperature over 102 F (39C) E. The patient vomited during the seizure and now is having trouble breathing

## 2021-04-12 NOTE — ED Notes (Signed)
Pillow provided and lights dimmed. Bed locked and in lowest position with seizure pads on both side rails. Pt in NAD and able to participate in Dr. Lianne Bushy assessment.

## 2021-04-13 LAB — LAMOTRIGINE LEVEL: Lamotrigine Lvl: 1.1 ug/mL — ABNORMAL LOW (ref 2.0–20.0)

## 2021-04-13 NOTE — Discharge Summary (Signed)
Physician Discharge Summary  Antonieta PertGordon C Keniston ZOX:[id number] DOB: 11/04/1981 DOA: 04/11/2021  PCP: Forest GleasonWest, Anne, MD  Admit date: 04/11/2021 Discharge date: 04/13/2021  Admitted From: Home Disposition: Home  Recommendations for Outpatient Follow-up:  1. Follow up with PCP in 1-2 weeks 2. Follow-up with neurology outpatient  Home Health: No Equipment/Devices: None  Discharge Condition: Stable CODE STATUS: Full Diet recommendation: Regular Brief/Interim Summary: [age] year old male with known epilepsy who presents for evaluation of breakthrough seizures.  Etiology of seizure is likely due to medication nonadherence.  On my interview the patient endorses not being able to pick up his meds since his previous hospitalization.  He does not have established primary care or outpatient doctors.  He also endorses severe anxiety and per bedside RN there was some concern about the violent behavior when he came in.  On my arrival he is calm and answers all my questions appropriately.  Received IV Depakote load in ED  Seen in consultation by neurology service.  Received another IV Depakote load to achieve a therapeutic range.  We had lengthy discussions regarding the patient's nonadherence to his prescribed insulin regimen.  It seems that there is a comprehension issue and the patient does not have a reliable PCP.  TOC involved and patient was given instructions on [facility].  He was informed that he could also [facility].  The importance of medication adherence was stressed the patient at length.  He expressed understanding.  On day of discharge patient is mentating clearly.  There was concern surrounding time of discharge the patient was nauseous and vomiting.  This was treated with Zofran.  Patient's electrolytes are stable and he is afebrile.  As such I gave patient the option of staying so we could further treat his intractable nausea and  vomiting.  He declined to do so and wished to go home.  As such we will provide prescription for Zofran also sent to his outpatient pharmacy.  Clear return to ED instructions have been provided to patient.  Patient will be discharged in stable condition.  He will need to follow-up with Dr. Malvin JohnsPotter as outpatient neurology for further management and titration of his antiepileptic therapy.   Discharge Diagnoses:  Active Problems:   Recurrent seizures (HCC)   Non compliance w medication regimen   Abnormal CXR  Breakthrough seizures Secondary to noncompliance with medication regimen Received IV Depakote load x2 to achieve therapeutic range Discharge on p.o. Depakote and p.o. Lamictal Clear instructions regarding up titration of Lamictal dose provided and prescription Discharged home with follow-up with Dr. Theora MasterZachary Potter  Anxiety Prescribed BuSpar 5 mg 3 times daily for generalized anxiety    Discharge Instructions  Discharge Instructions    Diet - low sodium heart healthy   Complete by: As directed    Increase activity slowly   Complete by: As directed      Allergies as of 04/12/2021      Reactions   Sulfa Antibiotics Shortness Of Breath, Anaphylaxis   OTHER REACTION   Banana Other (See Comments)   Pt reported      Medication List    STOP taking these medications   amitriptyline 25 MG tablet Commonly known as: ELAVIL   escitalopram 20 MG tablet Commonly known as: LEXAPRO   gabapentin 600 MG tablet Commonly known as: NEURONTIN     TAKE these medications   atorvastatin 20 MG tablet Commonly known as: LIPITOR Take 20 mg by  mouth at bedtime.   busPIRone 5 MG tablet Commonly known as: BUSPAR Take 1 tablet (5 mg total) by mouth 3 (three) times daily.   carvedilol 6.25 MG tablet Commonly known as: COREG Take 6.25 mg by mouth 2 (two) times daily with a meal.   divalproex 500 MG DR tablet Commonly known as: DEPAKOTE Take 1 tablet (500 mg total) by mouth every 12  (twelve) hours.   folic acid 1 MG tablet Commonly known as: FOLVITE Take 1 mg by mouth daily.   lamoTRIgine 25 MG tablet Commonly known as: LAMICTAL Take 1 tablet (25 mg total) by mouth 2 (two) times daily.  Continue Lamictal 25 mg tablet  BID - Lamictal needs to be uptitrated very carefully as below to achieve at least 100 BID dosing-faster up-titration puts him at very high risk for Trudie Buckler syndrome:  Week 1: 1 tablet each morning and 1 tablet each night this week.  Week 2: 1 tablet each morning and 2 tablets each night this week.  Week 3: 2 tablets each morning and 2 tablets each night this week.  Week 4: 2 tablets each morning and 3 tablets each night this week.  Week 5: 3 tablets each morning and 3 tablets each night this week.  Week 6: 3 tablets each morning and 4 tablets each night this week.  Week 7: 4 tablets each morning and 4 tablets each night this week. What changed: additional instructions   lisinopril 20 MG tablet Commonly known as: ZESTRIL Take 20 mg by mouth daily.   methotrexate 2.5 MG tablet Take 6 tablets by mouth every 7 (seven) days. Sunday   ondansetron 4 MG tablet Commonly known as: Zofran Take 1 tablet (4 mg total) by mouth daily as needed for nausea or vomiting.   pregabalin 100 MG capsule Commonly known as: LYRICA Take 1 capsule (100 mg total) by mouth 3 (three) times daily. What changed:   medication strength  how much to take  Another medication with the same name was removed. Continue taking this medication, and follow the directions you see here.   QUEtiapine 50 MG tablet Commonly known as: SEROQUEL Take by mouth. Take 50 mg by mouth at bedtime for 1 week, then 2 tablets by mouth at bedtime for 1 week, then 3 tablets by mouth at bedtime for 1 week   Suboxone 4-1 MG Film Generic drug: Buprenorphine HCl-Naloxone HCl Place 1 Film under the tongue in the morning, at noon, and at bedtime.   traZODone 50 MG tablet Commonly known  as: DESYREL Take 1-2 tablets (50-100 mg total) by mouth at bedtime.       Follow-up Information    Forest Gleason, MD. Schedule an appointment as soon as possible for a visit in 1 week(s).   Specialty: Family Medicine Contact information: [address], [address] [place] [address] [phone]        Enid Baas, MD Follow up.   Specialty: Internal Medicine Why: If you would like to establish primary care in [place], I believe Dr. Prudencio Pair office is accepting new patients.  Please establish some primary care provider in the area Contact information: [address] [place] [address] [phone]        Morene Crocker, MD Follow up.   Specialty: Neurology Why: Please follow up with Dr. Malvin Johns for titration of your seizure medication regimen Contact information: [address] [facility] [place] [address] [phone]  Allergies  Allergen Reactions  . Sulfa Antibiotics Shortness Of Breath and Anaphylaxis    OTHER REACTION  . Banana Other (See Comments)    Pt reported    Consultations:  Neurology   Procedures/Studies: CT Head Wo Contrast  Result Date: 04/11/2021 CLINICAL DATA:  Seizure with minor head trauma. EXAM: CT HEAD WITHOUT CONTRAST CT CERVICAL SPINE WITHOUT CONTRAST TECHNIQUE: Multidetector CT imaging of the head and cervical spine was performed following the standard protocol without intravenous contrast. Multiplanar CT image reconstructions of the cervical spine were also generated. COMPARISON:  March 05, 2021 and January 03, 2021. FINDINGS: CT HEAD FINDINGS Brain: No evidence of acute infarction, hemorrhage, hydrocephalus, extra-axial collection or mass lesion/mass effect. Vascular: No hyperdense vessel or unexpected calcification. Skull: Normal. Negative for fracture or focal lesion. Sinuses/Orbits: Paranasal sinuses are predominantly clear. Chronic right mastoid and  middle ear effusion. Left mastoid is clear. Other: None. CT CERVICAL SPINE FINDINGS Alignment: Unchanged reversal of the normal cervical lordosis centered at C6. No evidence of traumatic listhesis. Skull base and vertebrae: No acute fracture. No primary bone lesion or focal pathologic process. Soft tissues and spinal canal: No prevertebral fluid or swelling. No visible canal hematoma. Disc levels: Mild loss of disc space height with endplate spurring at C6-C7. Remainder of the disc spaces are preserved. Upper chest: Unremarkable. Other: None IMPRESSION: 1. No evidence of acute intracranial pathology. 2. No evidence of acute fracture or traumatic listhesis of the cervical spine. 3. Chronic right mastoid and middle ear effusion. 4. Mild degenerative disc disease at C6-C7. Electronically Signed   By: Maudry Mayhew MD   On: 04/11/2021 23:43   CT Cervical Spine Wo Contrast  Result Date: 04/11/2021 CLINICAL DATA:  Seizure with minor head trauma. EXAM: CT HEAD WITHOUT CONTRAST CT CERVICAL SPINE WITHOUT CONTRAST TECHNIQUE: Multidetector CT imaging of the head and cervical spine was performed following the standard protocol without intravenous contrast. Multiplanar CT image reconstructions of the cervical spine were also generated. COMPARISON:  March 05, 2021 and January 03, 2021. FINDINGS: CT HEAD FINDINGS Brain: No evidence of acute infarction, hemorrhage, hydrocephalus, extra-axial collection or mass lesion/mass effect. Vascular: No hyperdense vessel or unexpected calcification. Skull: Normal. Negative for fracture or focal lesion. Sinuses/Orbits: Paranasal sinuses are predominantly clear. Chronic right mastoid and middle ear effusion. Left mastoid is clear. Other: None. CT CERVICAL SPINE FINDINGS Alignment: Unchanged reversal of the normal cervical lordosis centered at C6. No evidence of traumatic listhesis. Skull base and vertebrae: No acute fracture. No primary bone lesion or focal pathologic process. Soft tissues  and spinal canal: No prevertebral fluid or swelling. No visible canal hematoma. Disc levels: Mild loss of disc space height with endplate spurring at C6-C7. Remainder of the disc spaces are preserved. Upper chest: Unremarkable. Other: None IMPRESSION: 1. No evidence of acute intracranial pathology. 2. No evidence of acute fracture or traumatic listhesis of the cervical spine. 3. Chronic right mastoid and middle ear effusion. 4. Mild degenerative disc disease at C6-C7. Electronically Signed   By: Maudry Mayhew MD   On: 04/11/2021 23:43   DG Chest Portable 1 View  Result Date: 04/11/2021 CLINICAL DATA:  Shortness of breath, cough, seizure EXAM: PORTABLE CHEST 1 VIEW COMPARISON:  October 05, 2017 FINDINGS: Borderline enlarged cardiac silhouette, likely accentuated by technique. Streaky left lower lobe opacity. No pleural effusion. No pneumothorax. The visualized skeletal structures are unremarkable. IMPRESSION: Streaky left lower lobe opacity, which may reflect atelectasis versus developing infiltrate. Electronically Signed   By: Maudry Mayhew  MD   On: 04/11/2021 23:46    (Echo, Carotid, EGD, Colonoscopy, ERCP)    Subjective: Seen and examined on the day of discharge.  Had some nausea and vomiting but otherwise stable.  Electrolytes vitals within normal limits.  Stable for discharge.  Discharge Exam: Vitals:   04/12/21 1213 04/12/21 1602  BP: 119/68 117/66  Pulse: 70 79  Resp: 18 17  Temp: 97.9 F (36.6 C) 98.1 F (36.7 C)  SpO2: 100% 98%   Vitals:   04/12/21 0731 04/12/21 0905 04/12/21 1213 04/12/21 1602  BP: (!) 140/91 (!) 138/98 119/68 117/66  Pulse: 79 81 70 79  Resp: 11 18 18 17   Temp:  98.3 F (36.8 C) 97.9 F (36.6 C) 98.1 F (36.7 C)  TempSrc:  Oral    SpO2: 100% 100% 100% 98%  Weight:      Height:        General: Pt is alert, awake, not in acute distress Cardiovascular: RRR, S1/S2 +, no rubs, no gallops Respiratory: CTA bilaterally, no wheezing, no rhonchi Abdominal:  Soft, NT, ND, bowel sounds + Extremities: no edema, no cyanosis    The results of significant diagnostics from this hospitalization (including imaging, microbiology, ancillary and laboratory) are listed below for reference.     Microbiology: Recent Results (from the past 240 hour(s))  Resp Panel by RT-PCR (Flu A&B, Covid) Nasopharyngeal Swab     Status: None   Collection Time: 04/11/21 11:06 PM   Specimen: Nasopharyngeal Swab; Nasopharyngeal(NP) swabs in vial transport medium  Result Value Ref Range Status   SARS Coronavirus 2 by RT PCR NEGATIVE NEGATIVE Final    Comment: (NOTE) SARS-CoV-2 target nucleic acids are NOT DETECTED.  The SARS-CoV-2 RNA is generally detectable in upper respiratory specimens during the acute phase of infection. The lowest concentration of SARS-CoV-2 viral copies this assay can detect is 138 copies/mL. A negative result does not preclude SARS-Cov-2 infection and should not be used as the sole basis for treatment or other patient management decisions. A negative result may occur with  improper specimen collection/handling, submission of specimen other than nasopharyngeal swab, presence of viral mutation(s) within the areas targeted by this assay, and inadequate number of viral copies(<138 copies/mL). A negative result must be combined with clinical observations, patient history, and epidemiological information. The expected result is Negative.  Fact Sheet for Patients:  04/13/21  Fact Sheet for Healthcare Providers:  BloggerCourse.com  This test is no t yet approved or cleared by the [place] FDA and  has been authorized for detection and/or diagnosis of SARS-CoV-2 by FDA under an Emergency Use Authorization (EUA). This EUA will remain  in effect (meaning this test can be used) for the duration of the COVID-19 declaration under Section 564(b)(1) of the Act, 21 U.S.C.section 360bbb-3(b)(1),  unless the authorization is terminated  or revoked sooner.       Influenza A by PCR NEGATIVE NEGATIVE Final   Influenza B by PCR NEGATIVE NEGATIVE Final    Comment: (NOTE) The Xpert Xpress SARS-CoV-2/FLU/RSV plus assay is intended as an aid in the diagnosis of influenza from Nasopharyngeal swab specimens and should not be used as a sole basis for treatment. Nasal washings and aspirates are unacceptable for Xpert Xpress SARS-CoV-2/FLU/RSV testing.  Fact Sheet for Patients: [place]  Fact Sheet for Healthcare Providers: BloggerCourse.com  This test is not yet approved or cleared by the [place] FDA and has been authorized for detection and/or diagnosis of SARS-CoV-2 by FDA under an Emergency Use Authorization (  EUA). This EUA will remain in effect (meaning this test can be used) for the duration of the COVID-19 declaration under Section 564(b)(1) of the Act, 21 U.S.C. section 360bbb-3(b)(1), unless the authorization is terminated or revoked.  Performed at [facility], [address] [place], [place] [address]      Labs: BNP (last 3 results) No results for input(s): BNP in the last 8760 hours. Basic Metabolic Panel: Recent Labs  Lab 04/11/21 2205 04/12/21 0722  NA 142 143  K 3.2* 3.3*  CL 104 109  CO2 14* 24  GLUCOSE 140* 104*  BUN 12 6  CREATININE 0.99 0.63  CALCIUM 9.3 8.3*   Liver Function Tests: No results for input(s): AST, ALT, ALKPHOS, BILITOT, PROT, ALBUMIN in the last 168 hours. No results for input(s): LIPASE, AMYLASE in the last 168 hours. No results for input(s): AMMONIA in the last 168 hours. CBC: Recent Labs  Lab 04/11/21 2205 04/12/21 0722  WBC 13.5* 6.0  NEUTROABS  --  4.0  HGB 12.8* 11.2*  HCT 39.2 32.0*  MCV 89.9 85.6  PLT 315 208   Cardiac Enzymes: No results for input(s): CKTOTAL, CKMB, CKMBINDEX, TROPONINI in the last 168 hours. BNP: Invalid input(s):  POCBNP CBG: No results for input(s): GLUCAP in the last 168 hours. D-Dimer No results for input(s): DDIMER in the last 72 hours. Hgb A1c No results for input(s): HGBA1C in the last 72 hours. Lipid Profile No results for input(s): CHOL, HDL, LDLCALC, TRIG, CHOLHDL, LDLDIRECT in the last 72 hours. Thyroid function studies No results for input(s): TSH, T4TOTAL, T3FREE, THYROIDAB in the last 72 hours.  Invalid input(s): FREET3 Anemia work up No results for input(s): VITAMINB12, FOLATE, FERRITIN, TIBC, IRON, RETICCTPCT in the last 72 hours. Urinalysis    Component Value Date/Time   COLORURINE YELLOW 01/25/2021 0205   APPEARANCEUR CLEAR 01/25/2021 0205   APPEARANCEUR Clear 07/28/2014 1806   LABSPEC 1.025 01/25/2021 0205   LABSPEC 1.035 07/28/2014 1806   PHURINE 5.5 01/25/2021 0205   GLUCOSEU NEGATIVE 01/25/2021 0205   GLUCOSEU Negative 07/28/2014 1806   HGBUR NEGATIVE 01/25/2021 0205   BILIRUBINUR NEGATIVE 01/25/2021 0205   BILIRUBINUR Negative 07/28/2014 1806   KETONESUR TRACE (A) 01/25/2021 0205   PROTEINUR 30 (A) 01/25/2021 0205   NITRITE NEGATIVE 01/25/2021 0205   LEUKOCYTESUR NEGATIVE 01/25/2021 0205   LEUKOCYTESUR Negative 07/28/2014 1806   Sepsis Labs Invalid input(s): PROCALCITONIN,  WBC,  LACTICIDVEN Microbiology Recent Results (from the past 240 hour(s))  Resp Panel by RT-PCR (Flu A&B, Covid) Nasopharyngeal Swab     Status: None   Collection Time: 04/11/21 11:06 PM   Specimen: Nasopharyngeal Swab; Nasopharyngeal(NP) swabs in vial transport medium  Result Value Ref Range Status   SARS Coronavirus 2 by RT PCR NEGATIVE NEGATIVE Final    Comment: (NOTE) SARS-CoV-2 target nucleic acids are NOT DETECTED.  The SARS-CoV-2 RNA is generally detectable in upper respiratory specimens during the acute phase of infection. The lowest concentration of SARS-CoV-2 viral copies this assay can detect is 138 copies/mL. A negative result does not preclude SARS-Cov-2 infection and  should not be used as the sole basis for treatment or other patient management decisions. A negative result may occur with  improper specimen collection/handling, submission of specimen other than nasopharyngeal swab, presence of viral mutation(s) within the areas targeted by this assay, and inadequate number of viral copies(<138 copies/mL). A negative result must be combined with clinical observations, patient history, and epidemiological information. The expected result is Negative.  Fact Sheet  for Patients:  BloggerCourse.com  Fact Sheet for Healthcare Providers:  [employer]ious[employer]ker.it  This test is no t yet approved or cleared by the [place] FDA and  has been authorized for detection and/or diagnosis of SARS-CoV-2 by FDA under an Emergency Use Authorization (EUA). This EUA will remain  in effect (meaning this test can be used) for the duration of the COVID-19 declaration under Section 564(b)(1) of the Act, 21 U.S.C.section 360bbb-3(b)(1), unless the authorization is terminated  or revoked sooner.       Influenza A by PCR NEGATIVE NEGATIVE Final   Influenza B by PCR NEGATIVE NEGATIVE Final    Comment: (NOTE) The Xpert Xpress SARS-CoV-2/FLU/RSV plus assay is intended as an aid in the diagnosis of influenza from Nasopharyngeal swab specimens and should not be used as a sole basis for treatment. Nasal washings and aspirates are unacceptable for Xpert Xpress SARS-CoV-2/FLU/RSV testing.  Fact Sheet for Patients: BloggerCourse.com  Fact Sheet for Healthcare Providers: [employer]ious[employer]ker.it  This test is not yet approved or cleared by the [place] FDA and has been authorized for detection and/or diagnosis of SARS-CoV-2 by FDA under an Emergency Use Authorization (EUA). This EUA will remain in effect (meaning this test can be used) for the duration of the COVID-19 declaration  under Section 564(b)(1) of the Act, 21 U.S.C. section 360bbb-3(b)(1), unless the authorization is terminated or revoked.  Performed at [facility], [address] [place], [place] [address]      Time coordinating discharge: Over 30 minutes  SIGNED:   Tresa Moore, MD  [facility] 04/13/2021, 11:22 AM Pager   If 7PM-7AM, please contact night-coverage

## 2021-11-25 ENCOUNTER — Emergency Department: Payer: Medicare Other

## 2021-11-25 ENCOUNTER — Emergency Department
Admission: EM | Admit: 2021-11-25 | Discharge: 2021-11-25 | Disposition: A | Discharge status: Home / Self Care | Payer: Medicare Other | Attending: Emergency Medicine | Admitting: Emergency Medicine

## 2021-11-25 ENCOUNTER — Encounter: Payer: Self-pay | Admitting: Emergency Medicine

## 2021-11-25 DIAGNOSIS — F17210 Nicotine dependence, cigarettes, uncomplicated: Secondary | ICD-10-CM | POA: Diagnosis not present

## 2021-11-25 DIAGNOSIS — G8911 Acute pain due to trauma: Secondary | ICD-10-CM | POA: Insufficient documentation

## 2021-11-25 DIAGNOSIS — W19XXXA Unspecified fall, initial encounter: Secondary | ICD-10-CM | POA: Insufficient documentation

## 2021-11-25 DIAGNOSIS — Z79899 Other long term (current) drug therapy: Secondary | ICD-10-CM | POA: Insufficient documentation

## 2021-11-25 DIAGNOSIS — I10 Essential (primary) hypertension: Secondary | ICD-10-CM | POA: Insufficient documentation

## 2021-11-25 DIAGNOSIS — R519 Headache, unspecified: Secondary | ICD-10-CM | POA: Insufficient documentation

## 2021-11-25 DIAGNOSIS — R55 Syncope and collapse: Secondary | ICD-10-CM | POA: Insufficient documentation

## 2021-11-25 LAB — COMPREHENSIVE METABOLIC PANEL
ALT: 34 U/L (ref 0–44)
AST: 30 U/L (ref 15–41)
Albumin: 3.7 g/dL (ref 3.5–5.0)
Alkaline Phosphatase: 73 U/L (ref 38–126)
Anion gap: 6 (ref 5–15)
BUN: 10 mg/dL (ref 6–20)
CO2: 26 mmol/L (ref 22–32)
Calcium: 8.5 mg/dL — ABNORMAL LOW (ref 8.9–10.3)
Chloride: 104 mmol/L (ref 98–111)
Creatinine, Ser: 0.6 mg/dL — ABNORMAL LOW (ref 0.61–1.24)
GFR, Estimated: >60 mL/min (ref >60)
Glucose, Bld: 105 mg/dL — ABNORMAL HIGH (ref 70–99)
Potassium: 3.3 mmol/L — ABNORMAL LOW (ref 3.5–5.1)
Sodium: 136 mmol/L (ref 135–145)
Total Bilirubin: 0.4 mg/dL (ref 0.3–1.2)
Total Protein: 7 g/dL (ref 6.5–8.1)

## 2021-11-25 LAB — CBC WITH DIFFERENTIAL/PLATELET
Abs Immature Granulocytes: 0.02 10*3/uL (ref 0.00–0.07)
Basophils Absolute: 0 10*3/uL (ref 0.0–0.1)
Basophils Relative: 1 %
Eosinophils Absolute: 0.1 10*3/uL (ref 0.0–0.5)
Eosinophils Relative: 2 %
HCT: 36.7 % — ABNORMAL LOW (ref 39.0–52.0)
Hemoglobin: 12.5 g/dL — ABNORMAL LOW (ref 13.0–17.0)
Immature Granulocytes: 0 %
Lymphocytes Relative: 32 %
Lymphs Abs: 1.9 10*3/uL (ref 0.7–4.0)
MCH: 30.4 pg (ref 26.0–34.0)
MCHC: 34.1 g/dL (ref 30.0–36.0)
MCV: 89.3 fL (ref 80.0–100.0)
Monocytes Absolute: 0.6 10*3/uL (ref 0.1–1.0)
Monocytes Relative: 10 %
Neutro Abs: 3.3 10*3/uL (ref 1.7–7.7)
Neutrophils Relative %: 55 %
Platelets: 158 10*3/uL (ref 150–400)
RBC: 4.11 MIL/uL — ABNORMAL LOW (ref 4.22–5.81)
RDW: 16.7 % — ABNORMAL HIGH (ref 11.5–15.5)
WBC: 6 10*3/uL (ref 4.0–10.5)
nRBC: 0 % (ref 0.0–0.2)

## 2021-11-25 LAB — CBG MONITORING, ED: Glucose-Capillary: 129 mg/dL — ABNORMAL HIGH (ref 70–99)

## 2021-11-25 LAB — ETHANOL: Alcohol, Ethyl (B): <10 mg/dL (ref <10)

## 2021-11-25 IMAGING — MR MR HEAD W/O CM
15 of 16 series · 46 of 48 positions shown · non-contrast
Comparison: Head CTs 10/15/2020 and earlier.

CLINICAL DATA: 39-year-old male with witnessed seizure, 3 seizures
in the past 24 hours.

EXAM:
MRI HEAD WITHOUT CONTRAST
TECHNIQUE: Multiplanar, multiecho pulse sequences of the brain and surrounding
structures were obtained without intravenous contrast.

[Series 5: T1 · sagittal · 5.0mm · 0.62mm/px · 3 of 25 slices shown (1 of 4)]
[im 1/25]
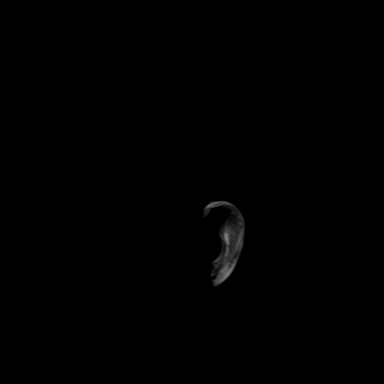
[im 13/25]
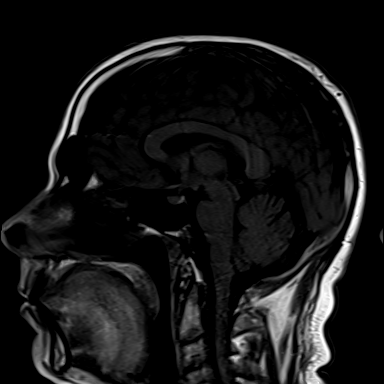
[im 25/25]
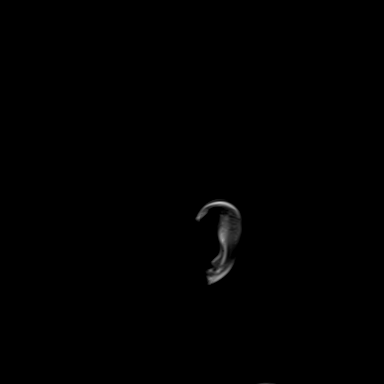

[Series 6: ax dwi_tracew · axial · 3.0mm · 0.60mm/px · z∈[-101,+53]mm · 5 of 48 slices shown (1 of 2)]
[im 1/48]
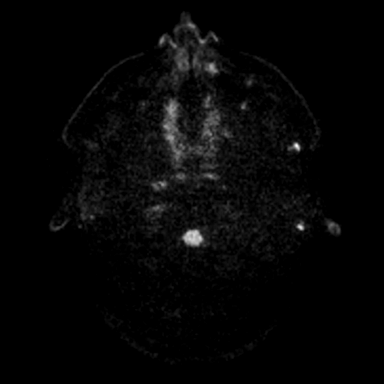
[im 12/48]
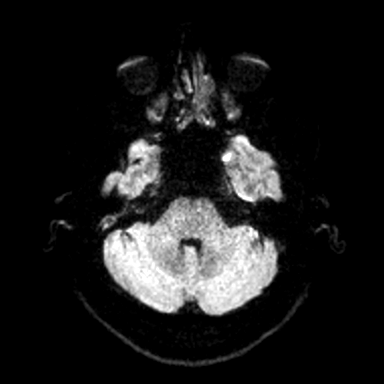
[im 24/48]
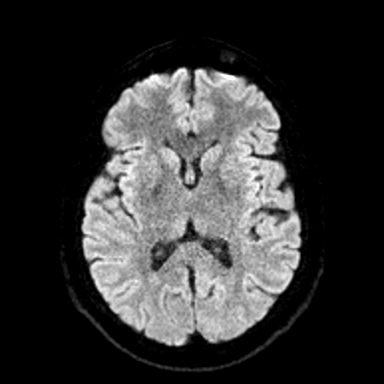
[im 36/48]
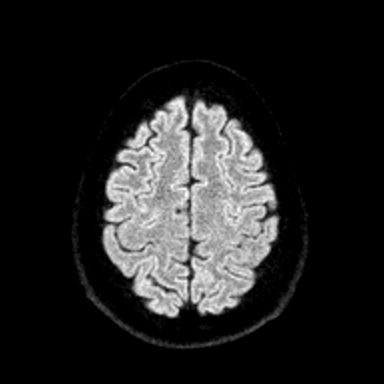
[im 48/48]
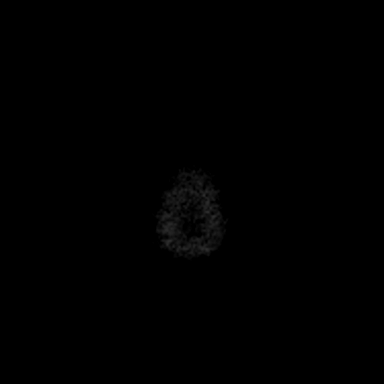

[Series 7: ax dwi_adc · axial · 3.0mm · 0.60mm/px · z∈[-101,+53]mm · 5 of 48 slices shown (1 of 2)]
[im 1/48]
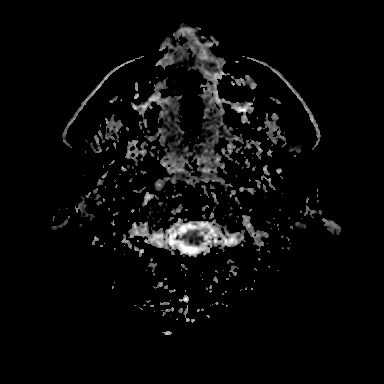
[im 12/48]
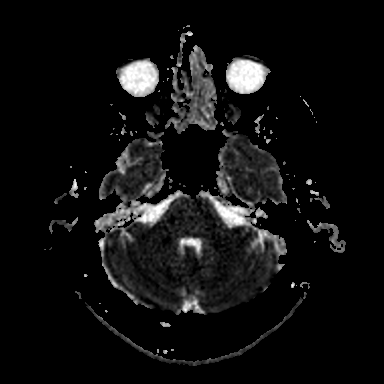
[im 24/48]
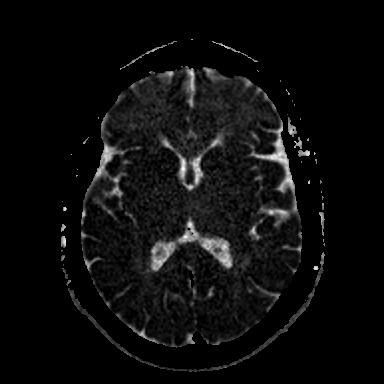
[im 36/48]
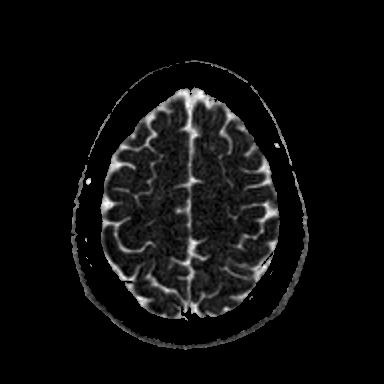
[im 48/48]
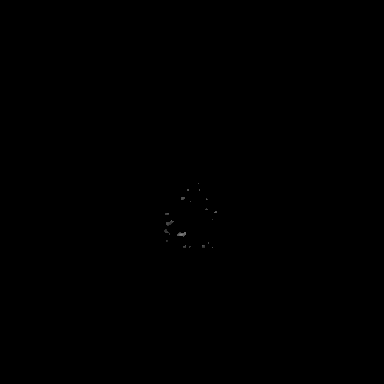

[Series 8: T1 · sagittal · 5.0mm · 0.94mm/px · 2 of 25 slices shown (2 of 4)]
[im 1/25]
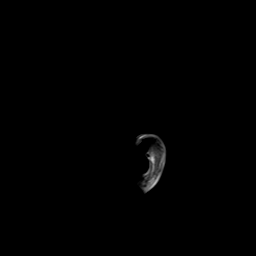
[im 25/25]
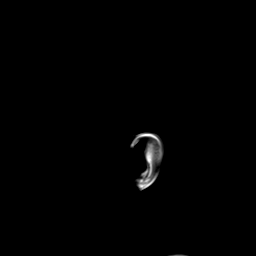

[Series 9: cor dwi_tracew · coronal · 5.0mm · 1.31mm/px · 3 of 40 slices shown]
[im 1/40]
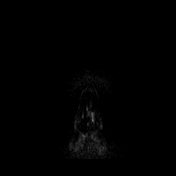
[im 20/40]
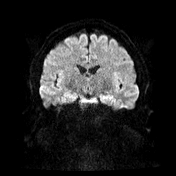
[im 40/40]
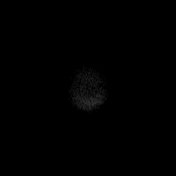

[Series 10: cor dwi_adc · coronal · 5.0mm · 1.31mm/px · 3 of 40 slices shown]
[im 1/40]
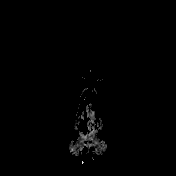
[im 20/40]
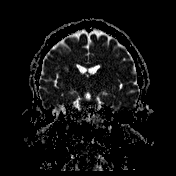
[im 40/40]
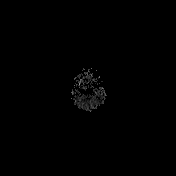

[Series 11: ax dwi_tracew · axial · 3.0mm · 1.31mm/px · z∈[-101,+53]mm · 4 of 48 slices shown (2 of 2)]
[im 1/48]
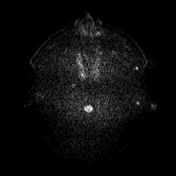
[im 16/48]
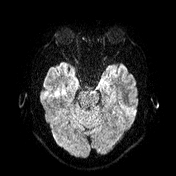
[im 32/48]
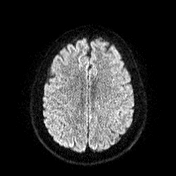
[im 48/48]
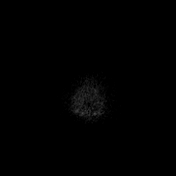

[Series 12: ax dwi_adc · axial · 3.0mm · 1.31mm/px · z∈[-101,+53]mm · 4 of 48 slices shown (2 of 2)]
[im 1/48]
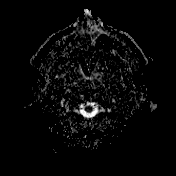
[im 16/48]
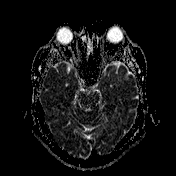
[im 32/48]
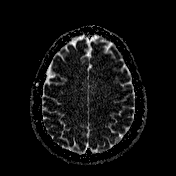
[im 48/48]
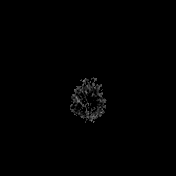

[Series 13: T2 · axial · 5.0mm · 0.45mm/px · z∈[-106,+50]mm · 2 of 27 slices shown (1 of 3)]
[im 1/27]
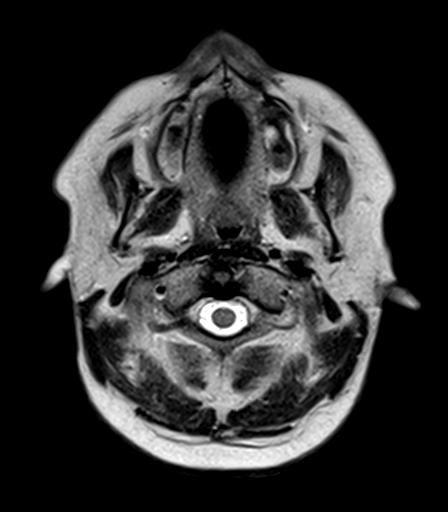
[im 27/27]
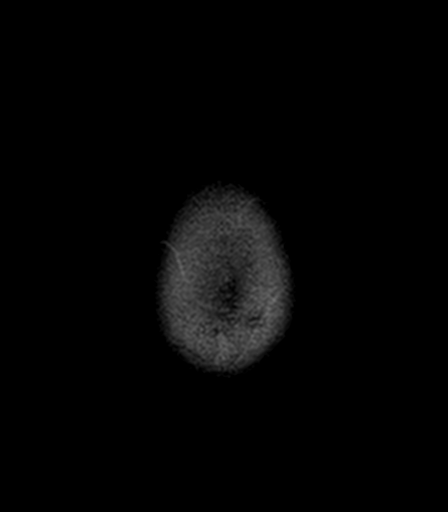

[Series 15: FLAIR · axial · 5.0mm · 1.20mm/px · z∈[-105,+50]mm · 2 of 27 slices shown (1 of 2)]
[im 1/27]
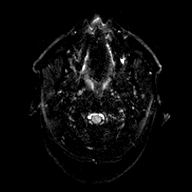
[im 27/27]
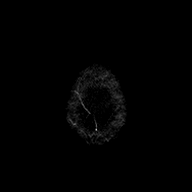

[Series 16: T1 · axial · 5.0mm · 0.90mm/px · z∈[-106,+50]mm · 2 of 27 slices shown (3 of 4)]
[im 1/27]
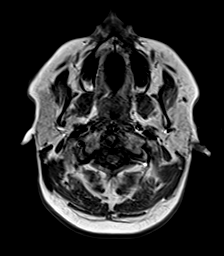
[im 27/27]
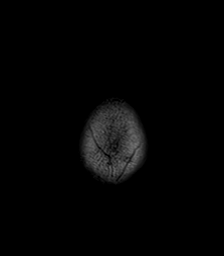

[Series 17: T2 · coronal · 3.0mm · 0.47mm/px · 3 of 35 slices shown (2 of 3)]
[im 1/35]
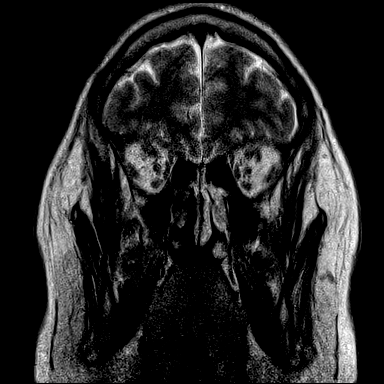
[im 18/35]
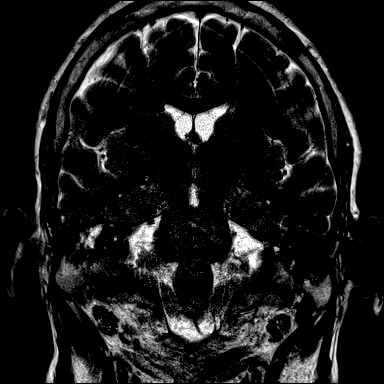
[im 35/35]
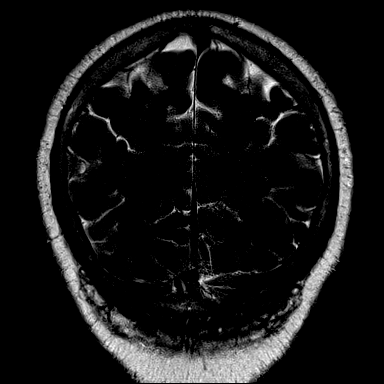

[Series 18: FLAIR · coronal · 3.0mm · 0.47mm/px · 3 of 35 slices shown (2 of 2)]
[im 1/35]
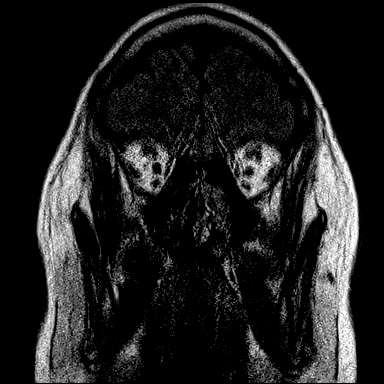
[im 18/35]
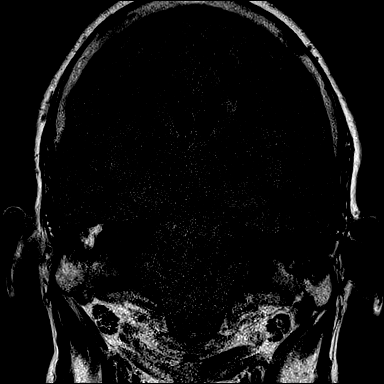
[im 35/35]
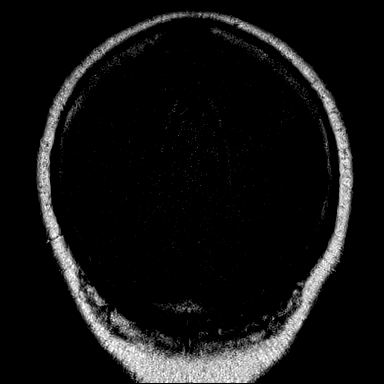

[Series 19: T2 · coronal · 3.0mm · 0.47mm/px · 3 of 35 slices shown (3 of 3)]
[im 1/35]
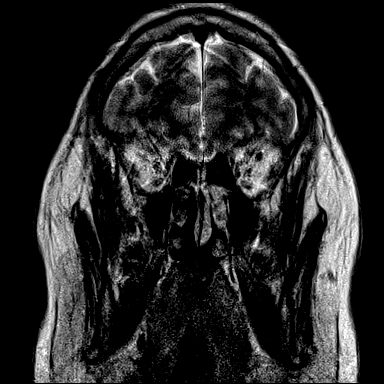
[im 18/35]
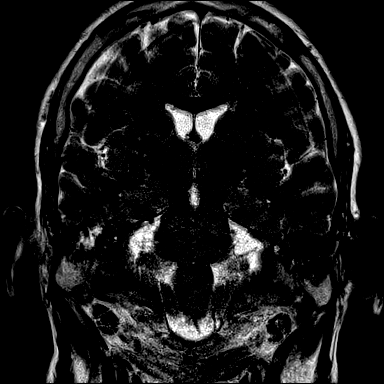
[im 35/35]
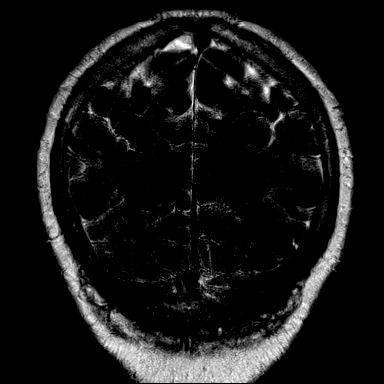

[Series 20: T1 · axial · 5.0mm · 0.90mm/px · z∈[-106,+50]mm · 2 of 27 slices shown (4 of 4)]
[im 1/27]
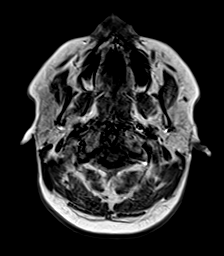
[im 27/27]
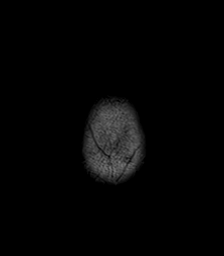

[46 of 48 positions shown; findings below may reference images not displayed]

FINDINGS: Brain: Overall cerebral volume is within normal limits. No
restricted diffusion to suggest acute infarction. No midline shift,
mass effect, evidence of mass lesion, ventriculomegaly, extra-axial
collection or acute intracranial hemorrhage. Cervicomedullary
junction and pituitary are within normal limits.

Scattered cerebral white matter T2 and FLAIR hyperintensity in both
hemispheres, more pronounced in the subcortical rather than the
periventricular white matter and moderately advanced for age. No
superimposed cortical encephalomalacia or chronic cerebral blood
products identified. The deep gray matter nuclei are within normal
limits, but there is moderate patchy T2 and FLAIR hyperintensity in
the pons. Cerebellum is within normal limits.

Thin slice coronal imaging of the temporal lobes is motion degraded
despite repeated imaging attempts. However, there is no definite
asymmetry in size or signal of the hippocampal formations (series
19, image 15). No migrational abnormality is identified.

Vascular: Major intracranial vascular flow voids are grossly
preserved.

Skull and upper cervical spine: Negative visible cervical spine,
negative visible cervical spine. Probable benign sclerosis at the
tip of the clivus (series 5, image 13) with other visible bone
marrow signal within normal limits.

Sinuses/Orbits: Negative orbits. Trace paranasal sinus mucosal
thickening.

Other: Chronic right middle ear and mastoid opacification. Left
mastoids remain clear.
IMPRESSION: 1. No acute intracranial abnormality identified.
2. Moderately advanced for age but nonspecific signal changes in the
bilateral cerebral white matter and pons.
3. Motion degraded thin slice temporal lobe imaging with no
convincing mesial temporal sclerosis.
4. Chronic right middle ear and mastoid opacification.

## 2021-11-25 NOTE — ED Triage Notes (Signed)
Pt dropped off by brother, after reported multiple falls, witnessed by 5 people per pt. Unable to get in contact with anyone who witnessed falls currently in triage, and pt is poor historian. Pt a&ox2, fell at check-in desk, landed onto L knee. C/o R head pain, does not recall entire events from tonight. Denies any ETOH or substance abuse. Hx of seizures

## 2021-11-25 NOTE — ED Notes (Addendum)
PTS BROTHER TO BEDSIDE FOR PTS SAFE DISCHARGE. PT AMBULATORY WITH STEADY GAIT, OUT OF ED, WITH BROTHER AND SECURITY.

## 2021-11-25 NOTE — Discharge Instructions (Addendum)
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
## 2021-11-26 ENCOUNTER — Emergency Department
Admission: EM | Admit: 2021-11-26 | Discharge: 2021-11-26 | Disposition: A | Discharge status: Home / Self Care | Payer: Medicare Other | Attending: Emergency Medicine | Admitting: Emergency Medicine

## 2021-11-26 ENCOUNTER — Encounter: Payer: Self-pay | Admitting: Emergency Medicine

## 2021-11-26 ENCOUNTER — Other Ambulatory Visit: Payer: Self-pay

## 2021-11-26 DIAGNOSIS — W01198A Fall on same level from slipping, tripping and stumbling with subsequent striking against other object, initial encounter: Secondary | ICD-10-CM | POA: Insufficient documentation

## 2021-11-26 DIAGNOSIS — S7011XA Contusion of right thigh, initial encounter: Secondary | ICD-10-CM | POA: Insufficient documentation

## 2021-11-26 DIAGNOSIS — S79921A Unspecified injury of right thigh, initial encounter: Secondary | ICD-10-CM | POA: Diagnosis present

## 2021-11-26 DIAGNOSIS — I10 Essential (primary) hypertension: Secondary | ICD-10-CM | POA: Insufficient documentation

## 2021-11-26 DIAGNOSIS — F17210 Nicotine dependence, cigarettes, uncomplicated: Secondary | ICD-10-CM | POA: Insufficient documentation

## 2021-11-26 DIAGNOSIS — M545 Low back pain, unspecified: Secondary | ICD-10-CM | POA: Insufficient documentation

## 2021-11-26 DIAGNOSIS — Z79899 Other long term (current) drug therapy: Secondary | ICD-10-CM | POA: Diagnosis not present

## 2021-11-26 MED ORDER — IBUPROFEN 400 MG PO TABS
400.0000 mg | ORAL_TABLET | Freq: Once | ORAL | Status: AC
  Administered 2021-11-26: 400 mg via ORAL
  Filled 2021-11-26: qty 1
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
## 2021-12-20 DEATH — deceased
